# Patient Record
Sex: Female | Born: 1965 | Race: White | Hispanic: No | Marital: Single | Smoking: Never smoker
Health system: Southern US, Community
[De-identification: ages and names within clinical notes are randomized; demographics above are authoritative.]

## PROBLEM LIST (undated history)

## (undated) DIAGNOSIS — M4310 Spondylolisthesis, site unspecified: Secondary | ICD-10-CM

## (undated) DIAGNOSIS — IMO0002 Reserved for concepts with insufficient information to code with codable children: Secondary | ICD-10-CM

## (undated) DIAGNOSIS — M129 Arthropathy, unspecified: Secondary | ICD-10-CM

## (undated) DIAGNOSIS — M4800 Spinal stenosis, site unspecified: Secondary | ICD-10-CM

## (undated) HISTORY — PX: LUMPECTOMY: SHX4764

## (undated) HISTORY — PX: CHOLECYSTECTOMY: SHX55

## (undated) HISTORY — PX: OTHER SURGICAL HISTORY: SHX169

## (undated) HISTORY — PX: BREAST SURGERY: SHX581

## (undated) HISTORY — PX: SPINAL FUSION: SHX223

## (undated) HISTORY — PX: TONSILLECTOMY: SUR1361

---

## 2013-01-15 ENCOUNTER — Emergency Department
Admission: EM | Admit: 2013-01-15 | Discharge: 2013-01-15 | Disposition: A | Discharge status: Home / Self Care | Payer: No Typology Code available for payment source | Attending: Emergency Medicine | Admitting: Emergency Medicine

## 2013-01-15 ENCOUNTER — Emergency Department: Payer: No Typology Code available for payment source

## 2013-01-15 DIAGNOSIS — X58XXXA Exposure to other specified factors, initial encounter: Secondary | ICD-10-CM | POA: Insufficient documentation

## 2013-01-15 DIAGNOSIS — R209 Unspecified disturbances of skin sensation: Secondary | ICD-10-CM | POA: Insufficient documentation

## 2013-01-15 DIAGNOSIS — S139XXA Sprain of joints and ligaments of unspecified parts of neck, initial encounter: Secondary | ICD-10-CM | POA: Insufficient documentation

## 2013-01-15 HISTORY — DX: Spondylolisthesis, site unspecified: M43.10

## 2013-01-15 HISTORY — DX: Reserved for concepts with insufficient information to code with codable children: IMO0002

## 2013-01-15 HISTORY — DX: Arthropathy, unspecified: M12.9

## 2013-01-15 HISTORY — DX: Spinal stenosis, site unspecified: M48.00

## 2013-01-15 MED ORDER — HYDROMORPHONE HCL 2 MG PO TABS
2.00 mg | ORAL_TABLET | Freq: Four times a day (QID) | ORAL | Status: DC | PRN
Start: 2013-01-15 — End: 2013-04-19

## 2013-01-15 MED ORDER — ONDANSETRON 4 MG PO TBDP
4.00 mg | ORAL_TABLET | Freq: Once | ORAL | Status: DC
Start: 2013-01-15 — End: 2013-01-15

## 2013-01-15 MED ORDER — KETOROLAC TROMETHAMINE 30 MG/ML IJ SOLN
60.0000 mg | Freq: Once | INTRAMUSCULAR | Status: AC
Start: 2013-01-15 — End: 2013-01-15
  Administered 2013-01-15: 60 mg via INTRAMUSCULAR
  Filled 2013-01-15: qty 2

## 2013-01-15 MED ORDER — HYDROMORPHONE HCL PF 1 MG/ML IJ SOLN
2.0000 mg | Freq: Once | INTRAMUSCULAR | Status: DC
Start: 2013-01-15 — End: 2013-01-15

## 2013-01-15 NOTE — Discharge Instructions (Signed)
Paresthesias    You have been seen for paresthesias.    Paresthesia is an abnormal sensation (feeling) in any part of the body. The paresthesia itself has no long-term bad effects. People often describe it as tingling, numbness, burning, or pricking of the skin. Many say it feels like "pins and needles," or like the body part is asleep.    Paresthesias can be a symptom of some illnesses. This means there are many things that can cause paresthesias. The paresthesias can be a sign of an underlying medical condition.     Some causes of paresthesias are:   - Skin Problems: Irritation of skin by certain chemicals. Swelling of the skin from an injury. A burn or frostbite can feel like numbness.   - Pressure on a nerve. This can happen when your arm "falls asleep" from lying on it too long. Carpal tunnel syndrome can do the same thing.   Hyperventilation (rapid or deep breathing).   Deficiency in some vitamins and minerals. This includes vitamins B1, B5, and B12.   Electrolyte problems.   Diabetic neuropathy (nerve disorders) from long-standing diabetes.    Problems with circulation.   Strokes.     You may have had some testing to help find out the cause of your paresthesias.    We still do not know the cause of your paresthesias. However, it is safe for you to go home. You may need more tests to figure out the cause.    See your primary care doctor or the referral specialist for more work-up and management of your paresthesias.    YOU SHOULD SEEK MEDICAL ATTENTION IMMEDIATELY, EITHER HERE OR AT THE NEAREST EMERGENCY DEPARTMENT, IF ANY OF THE FOLLOWING OCCUR:   Your arms get weak, numb or paralyzed (can t move), especially on one side.   You have vision loss, trouble speaking or problems thinking.   Your speech is abnormal or one side of your face droops.   You lose consciousness ("pass out") or almost lose consciousness.   You have numbness or tingling after a head, neck or back injury.   You feel  very dizzy or like the room is spinning.   You have other concerns.      Cervical Strain    You have been diagnosed with a neck strain, also called a cervical strain.    The cervical spine is between the base of the skull and the top of the shoulders.    A strain happens when a muscle is stretched, torn or injured. The pain that you feel is caused by inflammation (swelling) or bruising in the muscle. A strain is not the same as a sprain. A sprain is an injury to a ligament that holds bones together.    A cervical strain occurs when the head snaps forward during an accident or a fall. The muscles can easily be strained with this type of movement. It is normal to experience pain over the muscles around the neck but not over the bones of the cervical spine.    The x-rays of your neck showed no evidence of broken bones.    Apply a warm damp washcloth to the neck for 20 minutes at a time, at least 4 times per day. This will reduce your pain. Massaging your neck might also help.    It is normal to feel stiffness and pain in your neck after a strain. This pain may last for the next few days. If your pain stays about   the same or gets better, you probably do not need to see a doctor. However, if your symptoms get worse or you have new symptoms, you should return here or go to the nearest Emergency Department.    Call your physician or go to the nearest Emergency Department if you your pain does not improve within 4 weeks or your pain is bad enough to seriously limit your normal activities.    YOU SHOULD SEEK MEDICAL ATTENTION IMMEDIATELY, EITHER HERE OR AT THE NEAREST EMERGENCY DEPARTMENT, IF ANY OF THE FOLLOWING OCCURS:   Your arms and legs tingle or get numb (lose feeling).   Your arms or legs are weak.   You feel that your neck is unstable.   You lose control of your bladder or bowels. If this were to happen, it may cause you to wet or soil yourself. Some people may actually have problems urinating  instead.   Your pain gets worse.

## 2013-01-15 NOTE — ED Notes (Signed)
Pt states she began having right neck, shoulder and arm pain about a week ago. Pt denies injury. Pt states she is also having "burning" in her buttock area which she states is not new

## 2013-01-15 NOTE — ED Provider Notes (Signed)
Physician/Midlevel provider first contact with patient: 01/15/13 2108         History     Chief Complaint   Patient presents with   . Shoulder Pain     HPI Comments: Patient with history of chronic back pain and disease, started with R upper back pain radiationg down her arm with pins and needles sensations, no weakness, only pain. No injury.     Patient is a 47 y.o. female presenting with shoulder pain. The history is provided by the patient.   Shoulder Pain  This is a new problem. The current episode started in the past 7 days. The problem occurs intermittently. The problem has been unchanged. Associated symptoms include neck pain. Pertinent negatives include no chest pain, chills, coughing, fever or joint swelling. The symptoms are aggravated by bending. She has tried nothing for the symptoms. The treatment provided no relief.       Past Medical History   Diagnosis Date   . Degenerative disc disease    . Spondylolisthesis    . Spinal stenosis    . Arthropathies        Past Surgical History   Procedure Date   . Spinal fusion    . Lumpectomy    . Cholecystectomy    . Cesarean section    . Tonsillectomy    . Adnoidectomy    . Uvulaectomy    . Uterine ablation    . Uterine ablation        No family history on file.    Social  History   Substance Use Topics   . Smoking status: Never Smoker    . Smokeless tobacco: Not on file   . Alcohol Use: Yes      Comment: socially       .     Allergies   Allergen Reactions   . Percocet (Oxycodone-Acetaminophen)        Current/Home Medications    HYDROMORPHONE (DILAUDID) 4 MG TABLET    Take 4 mg by mouth every 4 (four) hours as needed.    IBUPROFEN (ADVIL,MOTRIN) 200 MG TABLET    Take 600 mg by mouth every 6 (six) hours as needed.        Review of Systems   Constitutional: Negative for fever and chills.   HENT: Positive for neck pain. Negative for neck stiffness.    Respiratory: Negative for cough and shortness of breath.    Cardiovascular: Negative for chest pain and leg  swelling.   Musculoskeletal: Positive for back pain. Negative for joint swelling.       Physical Exam    BP 160/68  Pulse 92  Temp 97.1 F (36.2 C)  Resp 16  Ht 1.702 m  Wt 127.007 kg  BMI 43.84 kg/m2  SpO2 98%  LMP 12/25/2012    Physical Exam   Nursing note and vitals reviewed.  Constitutional: She is oriented to person, place, and time. She appears well-developed and well-nourished.   HENT:   Head: Normocephalic and atraumatic.   Eyes: Conjunctivae normal are normal. Right eye exhibits no discharge.   Neck: Normal range of motion. Neck supple.   Cardiovascular: Normal rate and regular rhythm.    Pulmonary/Chest: Effort normal and breath sounds normal.   Abdominal: Soft. Bowel sounds are normal.   Musculoskeletal: Normal range of motion.        Right shoulder: She exhibits normal range of motion, no bony tenderness, no swelling, no effusion, no crepitus, no deformity, no laceration,  normal pulse and normal strength.        Arms:       Good distal sensation and strength, good pulse, mild cervical midline tenderness   Neurological: She is alert and oriented to person, place, and time.   Skin: Skin is warm and dry.   Psychiatric: She has a normal mood and affect. Her behavior is normal.       MDM and ED Course     ED Medication Orders      Start     Status Ordering Provider    01/15/13 2202   ondansetron (ZOFRAN-ODT) disintegrating tablet 4 mg   Once      Route: Oral  Ordered Dose: 4 mg         Ordered Lenor Derrick M    01/15/13 2202   HYDROmorphone (DILAUDID) injection 2 mg   Once      Route: Intramuscular  Ordered Dose: 2 mg         Ordered Kalyna Paolella M                 MDM  Number of Diagnoses or Management Options  Cervical strain, initial encounter:   Paresthesia:   Diagnosis management comments: I, Lenor Derrick PA-C, have been the primary provider for Curt Bears during this Emergency Dept visit.   Oxygen saturation by pulse oximetry is 95%-100%, Normal.  Interventions: None  Needed.  I have reviewed nursing history.     The attending signature signifies review and agreement of the history, physical examination, evaluation, clinical impression and plan except as noted.     DD: cervical spine fracture, strain, radiculopathy    Must f/u with spine.   Patient refused IM shots here and to go medications, prefers prescription, has tolerated dilaudid in past.     CD given to patient.     Procedures  Radiology Results (24 Hour)     Procedure Component Value Units Date/Time    CT Cervical Spine without Contrast [[id number]] Collected:01/15/13 2157    Order Status:Completed  Updated:01/15/13 2207    Narrative:    History: Neck pain.    FINDINGS: Contiguous axial, reformatted coronal and sagittal images  through the cervical spine. There are no comparison studies.        The cervical vertebral body alignment and vertebral body heights are  normally preserved. No acute fractures are seen. At the C3-C4 level  there is a shallow broad posterior disc osteophyte complex which is  eccentric to the right side, mild to moderate spinal canal narrowing,  mild to moderate narrowing of the left neural foramen. At the C5-C6  level of the right foraminal disc osteophyte complex results in moderate  to marked narrowing of the right neural foramen. At the C6-C7 level a  broad posterior disc osteophyte complex results in moderate spinal canal  narrowing, moderate to marked neural foraminal narrowing.      Impression:     Degenerative changes. No CT evidence of an acute osseous  injury to the cervical spine.    Terrilee Croak, MD   01/15/2013 10:03 PM        Clinical Impression & Disposition     Clinical Impression  Final diagnoses:   Cervical strain, initial encounter   Paresthesia        ED Disposition     Discharge Curt Bears discharge to home/self care.    Condition at discharge: Stable  New Prescriptions    HYDROMORPHONE (DILAUDID) 2 MG TABLET    Take 1 tablet (2 mg total) by mouth every 6  (six) hours as needed for Pain (As needed for pain).               Lenor Derrick [place], [place]  01/15/13 2238

## 2013-01-17 NOTE — ED Provider Notes (Signed)
Review of MLP charts:  I, Louis Matte, MD  have reviewed the history, physical exam, evaluation, clinical impression, plan and agree.  I am available to discuss patient's care and see patient if needed    Oliver Barre, MD  01/17/13 2210

## 2013-04-19 ENCOUNTER — Emergency Department: Payer: No Typology Code available for payment source

## 2013-04-19 ENCOUNTER — Emergency Department
Admission: EM | Admit: 2013-04-19 | Discharge: 2013-04-19 | Disposition: A | Discharge status: Home / Self Care | Payer: No Typology Code available for payment source | Attending: Emergency Medicine | Admitting: Emergency Medicine

## 2013-04-19 DIAGNOSIS — M948X9 Other specified disorders of cartilage, unspecified sites: Secondary | ICD-10-CM | POA: Insufficient documentation

## 2013-04-19 MED ORDER — IBUPROFEN 800 MG PO TABS
800.0000 mg | ORAL_TABLET | Freq: Three times a day (TID) | ORAL | Status: DC | PRN
Start: 2013-04-19 — End: 2013-09-14

## 2013-04-19 MED ORDER — IBUPROFEN 400 MG PO TABS
800.0000 mg | ORAL_TABLET | Freq: Once | ORAL | Status: AC
Start: 2013-04-19 — End: 2013-04-19
  Administered 2013-04-19: 800 mg via ORAL
  Filled 2013-04-19: qty 2

## 2013-04-19 MED ORDER — HYDROCODONE-ACETAMINOPHEN 5-325 MG PO TABS
1.0000 | ORAL_TABLET | Freq: Four times a day (QID) | ORAL | Status: DC | PRN
Start: 2013-04-19 — End: 2013-09-11

## 2013-04-19 MED ORDER — HYDROCODONE-ACETAMINOPHEN 5-325 MG PO TABS
1.00 | ORAL_TABLET | Freq: Once | ORAL | Status: AC
Start: 2013-04-19 — End: 2013-04-19
  Administered 2013-04-19: 1 via ORAL
  Filled 2013-04-19: qty 1

## 2013-04-19 NOTE — ED Notes (Signed)
Pt CO left foot pain and plantar swelling x 3 days. Pt states most of the pain is around the great toe. + 2 swelling to plantar surface. Pt denies injury and trauma. Pt unable to walk without a limp and states that limping makes her chronic back pain worse.

## 2013-04-19 NOTE — Discharge Instructions (Signed)
Tendonitis     You have been diagnosed with tendonitis.     Tendons are strong bands of connective tissue that attach the muscles to bone. "Itis" means inflammation. Tendonitis occurs when the tendon and its coverings become irritated and inflamed. The cause is often, but not always, excessive repetitive use of a particular muscle group.     Tendonitis is treated with pain medications and sometimes a splint to reduce the movement of the injured tendon. Additional treatments include Resting, Icing, Compressing, and Elevating the injured area. Remember this as "RICE."  · REST: Limit the use of the injured body part.  · ICE: By applying ice to the affected area, swelling and pain can be reduced. Place some ice cubes in a re-sealable (Ziploc) bag and add some water. Put a thin washcloth between the bag and the skin. Apply the ice bag to the area for at least 20 minutes. Do this at least 4 times per day. Using the ice for longer times and more frequently is OK. NEVER APPLY ICE DIRECTLY TO THE SKIN.  · COMPRESS: Compression means to apply pressure around the injured area such as with a splint, cast or an ace bandage. Compression decreases swelling and improves comfort. Compression should be tight enough to relieve swelling but not so tight as to decrease circulation. Increasing pain, numbness, tingling, or change in skin color, are all signs of decreased circulation.  · ELEVATE: Elevate the injured part. For example, a painful arm can be elevated by placing the arm in a sling while awake and propped up on pillows while lying down.     YOU SHOULD SEEK MEDICAL ATTENTION IMMEDIATELY, EITHER HERE OR AT THE NEAREST EMERGENCY DEPARTMENT, IF ANY OF THE FOLLOWING OCCURS:  · You experience a severe increase in pain or swelling in the affected area.  · You develop new numbness and tingling in or below the affected area.  · You develop a cold, pale foot/hand that appears to have a problem with its blood supply.  · You develop a  fever, chills or increased redness over the affected area.    R.I.C.E.     Some things you can do to help your injury are: Resting, Icing, Compressing and Elevating the injured area. Remember this as "RICE."  · REST: Limit the use of the injured body part.  · ICE: By applying ice to the affected area, swelling and pain can be reduced. Place some ice cubes in a re-sealable (Ziploc) bag and add some water. Put a thin washcloth between the bag and the skin. Apply the ice bag to the area for at least 20 minutes. Do this at least 4 times per day. It is okay to do this more often than directed. You can also do it for longer than directed. NEVER APPLY ICE DIRECTLY TO THE SKIN.  · COMPRESS: Compression means to apply pressure around the injured area such as with a splint, cast or an ace bandage. Compression decreases swelling and improves comfort. Compression should be tight enough to relieve swelling but not so tight as to decrease circulation. Increasing pain, numbness, tingling, or change in skin color, are all signs of decreased circulation.  · ELEVATE: Elevate the injured part. For example, elevate your foot by placing it on a chair while sitting, or propping it up on pillows when lying down.

## 2013-04-19 NOTE — ED Provider Notes (Signed)
Physician/Midlevel provider first contact with patient: 04/19/13 2109         History     Chief Complaint   Patient presents with   . Foot Pain     Patient is a 47 y.o. female presenting with foot injury. The history is provided by the patient.   Foot Injury   Incident onset: 3 days. The incident occurred at home. There was no injury mechanism. Pain location: right foot, 1st metatarsal pain. The quality of the pain is described as aching and throbbing. The pain is at a severity of 7/10. The pain is moderate. The pain has been constant since onset. Pertinent negatives include no numbness, no inability to bear weight, no loss of motion, no muscle weakness, no loss of sensation and no tingling. The symptoms are aggravated by activity, bearing weight and palpation. She has tried NSAIDs for the symptoms. The treatment provided no relief.       Past Medical History   Diagnosis Date   . Degenerative disc disease    . Spondylolisthesis    . Spinal stenosis    . Arthropathies        Past Surgical History   Procedure Date   . Spinal fusion      x 2   . Lumpectomy    . Cholecystectomy    . Cesarean section    . Tonsillectomy    . Adnoidectomy    . Uvulaectomy    . Uterine ablation    . Uterine ablation        No family history on file.    Social  History   Substance Use Topics   . Smoking status: Never Smoker    . Smokeless tobacco: Not on file   . Alcohol Use: Yes      Comment: socially       .     Allergies   Allergen Reactions   . Percocet (Oxycodone-Acetaminophen)        Current/Home Medications    IBUPROFEN (ADVIL,MOTRIN) 200 MG TABLET    Take 600 mg by mouth every 6 (six) hours as needed.        Review of Systems   Constitutional: Negative for fever, chills and unexpected weight change.   HENT: Negative for neck pain and neck stiffness.    Respiratory: Negative for cough, chest tightness and shortness of breath.    Cardiovascular: Negative for chest pain and palpitations.   Gastrointestinal: Negative for nausea,  vomiting and abdominal pain.   Musculoskeletal: Positive for joint swelling and gait problem. Negative for back pain.        Limping due to pain   Skin: Negative for color change, pallor, rash and wound.   Neurological: Negative for dizziness, tingling, weakness, light-headedness, numbness and headaches.   All other systems reviewed and are negative.        Physical Exam    BP 138/90  Pulse 98  Temp 98.7 F (37.1 C)  Resp 20  Ht 1.702 m  Wt 115.667 kg  BMI 39.93 kg/m2  SpO2 100%    Physical Exam   Nursing note and vitals reviewed.  Constitutional: She is oriented to person, place, and time. She appears well-developed and well-nourished.   HENT:   Head: Normocephalic and atraumatic.   Eyes: Conjunctivae normal and EOM are normal. No scleral icterus.   Neck: Normal range of motion. Neck supple.        No meningeal signs   Cardiovascular: Normal rate, regular  rhythm, normal heart sounds and intact distal pulses.    Pulses:       Dorsalis pedis pulses are 2+ on the right side, and 2+ on the left side.   Pulmonary/Chest: Effort normal and breath sounds normal.   Abdominal: Soft. Bowel sounds are normal. She exhibits no distension. There is no tenderness. There is no rebound and no guarding.        No pulsatile mass   Musculoskeletal: Normal range of motion. She exhibits no edema.        Left hip: Normal.        Left knee: Normal.        Thoracic back: Normal. She exhibits normal range of motion, no tenderness and no bony tenderness.        Lumbar back: Normal. She exhibits normal range of motion, no tenderness and no bony tenderness.        Right foot: She exhibits tenderness. She exhibits normal range of motion, no swelling, normal capillary refill and no deformity.        Feet:    Neurological: She is alert and oriented to person, place, and time.   Skin: Skin is warm and dry. No rash noted. No pallor.   Psychiatric: She has a normal mood and affect. Her behavior is normal. Thought content normal.       MDM and  ED Course     ED Medication Orders      Start     Status Ordering Provider    04/19/13 2159   HYDROcodone-acetaminophen (NORCO) 5-325 MG per tablet 1 tablet   Once      Route: Oral  Ordered Dose: 1 tablet         Last MAR action:  Given Nidya Bouyer    04/19/13 2159   ibuprofen (ADVIL,MOTRIN) tablet 800 mg   Once      Route: Oral  Ordered Dose: 800 mg         Last MAR action:  Given Omya Winfield                 MDM  Number of Diagnoses or Management Options  Sesamoiditis:   Diagnosis management comments: Pain to plantar portion of MT joint for 3 days. States she did do the [employer] in [place] 1 month ago, but no pain then. 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 Pain worse with movement and walking    xr shows sesmoid bones and pt has tenderness mainly to the MT plantar region consistent with sesmoiditis. No signs of septic joint or infection. NVI and nml pulses. Plan RICE will f/u with Podiatry.   I, Carolina Sink- Physician Assistant, have been the primary provider for this pt during their ED stay.     The attending signature signifies review and agreement of the history, physical examination, evaluation, clinical impression and plan except as noted.     02 sat  100%ra, which is nml.           Procedures  Radiology Results (24 Hour)     Procedure Component Value Units Date/Time    Foot Left AP Lateral and Oblique [[id number]] Collected:04/19/13 2153    Order Status:Completed  Updated:04/19/13 2158    Narrative:    History: Left foot pain.    FINDINGS: 3 AP, lateral, and oblique views were obtained.    There is no evidence of a fracture. The joint spaces are normal. There  are bipartite  tibial and fibular sesamoids of the first MTP joint. There  is a small inferior calcaneal spur. There is no evidence of soft tissue  gas.      Impression:      No acute changes.    Lanier Ensign, MD   04/19/2013 9:54 PM          Clinical Impression & Disposition     Clinical Impression  Final diagnoses:    Sesamoiditis        ED Disposition     Discharge Curt Bears discharge to home/self care.    Condition at disposition: Stable             New Prescriptions    HYDROCODONE-ACETAMINOPHEN (NORCO) 5-325 MG PER TABLET    Take 1 tablet by mouth every 6 (six) hours as needed for Pain.    IBUPROFEN (ADVIL,MOTRIN) 800 MG TABLET    Take 1 tablet (800 mg total) by mouth every 8 (eight) hours as needed for Pain or Fever.               Mariel Aloe [place], [place]  04/19/13 2236

## 2013-04-20 NOTE — ED Provider Notes (Signed)
ATTENDING : Helayne Seminole, MD.  Review of MLP chart- I have reviewed the history, PE, evaluation, clinical impression, plan and agree.              Helayne Seminole, MD  04/20/13 [phone]

## 2013-04-21 ENCOUNTER — Telehealth (INDEPENDENT_AMBULATORY_CARE_PROVIDER_SITE_OTHER): Payer: Self-pay

## 2013-04-21 NOTE — Telephone Encounter (Signed)
RN Navigator received patient referral following ER visit and placed call to patient to introduce role of Nurse Navigator as well as to assist with an ER follow up appointment.  Spoke with patient who agreed to be sent list of [place] primary care physicians and offered assistance with making ER follow up appointment with a PCP.    Patient followed up with her podiatrist today.

## 2013-08-27 ENCOUNTER — Emergency Department: Payer: No Typology Code available for payment source

## 2013-08-27 ENCOUNTER — Emergency Department
Admission: EM | Admit: 2013-08-27 | Discharge: 2013-08-27 | Disposition: A | Discharge status: Home / Self Care | Payer: No Typology Code available for payment source | Attending: Emergency Medicine | Admitting: Emergency Medicine

## 2013-08-27 DIAGNOSIS — T50905A Adverse effect of unspecified drugs, medicaments and biological substances, initial encounter: Secondary | ICD-10-CM

## 2013-08-27 DIAGNOSIS — M129 Arthropathy, unspecified: Secondary | ICD-10-CM | POA: Insufficient documentation

## 2013-08-27 DIAGNOSIS — M549 Dorsalgia, unspecified: Secondary | ICD-10-CM | POA: Insufficient documentation

## 2013-08-27 DIAGNOSIS — G8929 Other chronic pain: Secondary | ICD-10-CM | POA: Insufficient documentation

## 2013-08-27 DIAGNOSIS — Z981 Arthrodesis status: Secondary | ICD-10-CM | POA: Insufficient documentation

## 2013-08-27 DIAGNOSIS — R1110 Vomiting, unspecified: Secondary | ICD-10-CM

## 2013-08-27 DIAGNOSIS — M4800 Spinal stenosis, site unspecified: Secondary | ICD-10-CM | POA: Insufficient documentation

## 2013-08-27 DIAGNOSIS — T40605A Adverse effect of unspecified narcotics, initial encounter: Secondary | ICD-10-CM | POA: Insufficient documentation

## 2013-08-27 DIAGNOSIS — IMO0002 Reserved for concepts with insufficient information to code with codable children: Secondary | ICD-10-CM | POA: Insufficient documentation

## 2013-08-27 LAB — COMPREHENSIVE METABOLIC PANEL
ALT: 43 U/L (ref 0–55)
AST (SGOT): 36 U/L — ABNORMAL HIGH (ref 5–34)
Albumin/Globulin Ratio: 1.3 (ref 0.9–2.2)
Albumin: 4.4 g/dL (ref 3.5–5.0)
Alkaline Phosphatase: 98 U/L (ref 40–150)
Anion Gap: 14 (ref 5.0–15.0)
BUN: 20.2 mg/dL — ABNORMAL HIGH (ref 7.0–19.0)
Bilirubin, Total: 0.7 mg/dL (ref 0.2–1.2)
CO2: 20 mEq/L — ABNORMAL LOW (ref 22–29)
Calcium: 9.6 mg/dL (ref 8.5–10.5)
Chloride: 104 mEq/L (ref 98–107)
Creatinine: 0.9 mg/dL (ref 0.6–1.0)
Globulin: 3.4 g/dL (ref 2.0–3.6)
Glucose: 172 mg/dL — ABNORMAL HIGH (ref 70–100)
Potassium: 3.9 mEq/L (ref 3.5–5.1)
Protein, Total: 7.8 g/dL (ref 6.0–8.3)
Sodium: 138 mEq/L (ref 136–145)

## 2013-08-27 LAB — CELL MORPHOLOGY
Cell Morphology: NORMAL
Platelet Estimate: NORMAL

## 2013-08-27 LAB — CBC AND DIFFERENTIAL
Basophils Absolute Automated: 0.07 10*3/uL (ref 0.00–0.20)
Basophils Automated: 0 %
Eosinophils Absolute Automated: 0.1 10*3/uL (ref 0.00–0.70)
Eosinophils Automated: 1 %
Hematocrit: 42.5 % (ref 37.0–47.0)
Hgb: 15.1 g/dL (ref 12.0–16.0)
Immature Granulocytes Absolute: 0.06 10*3/uL — ABNORMAL HIGH
Immature Granulocytes: 0 %
Lymphocytes Absolute Automated: 3.57 10*3/uL (ref 0.50–4.40)
Lymphocytes Automated: 21 %
MCH: 32.5 pg — ABNORMAL HIGH (ref 28.0–32.0)
MCHC: 35.5 g/dL (ref 32.0–36.0)
MCV: 91.4 fL (ref 80.0–100.0)
MPV: 9.6 fL (ref 9.4–12.3)
Monocytes Absolute Automated: 1.13 10*3/uL (ref 0.00–1.20)
Monocytes: 7 %
Neutrophils Absolute: 12.35 10*3/uL — ABNORMAL HIGH (ref 1.80–8.10)
Neutrophils: 72 %
Platelets: 342 10*3/uL (ref 140–400)
RBC: 4.65 10*6/uL (ref 4.20–5.40)
RDW: 13 % (ref 12–15)
WBC: 17.22 10*3/uL — ABNORMAL HIGH (ref 3.50–10.80)

## 2013-08-27 LAB — GFR: EGFR: >60

## 2013-08-27 MED ORDER — SODIUM CHLORIDE 0.9 % IV BOLUS
1000.00 mL | Freq: Once | INTRAVENOUS | Status: AC
Start: 2013-08-27 — End: 2013-08-27
  Administered 2013-08-27: 1000 mL via INTRAVENOUS

## 2013-08-27 MED ORDER — PROMETHAZINE HCL 25 MG/ML IJ SOLN
12.5000 mg | Freq: Once | INTRAMUSCULAR | Status: AC
Start: 2013-08-27 — End: 2013-08-27
  Administered 2013-08-27: 12.5 mg via INTRAVENOUS
  Filled 2013-08-27: qty 1

## 2013-08-27 MED ORDER — MORPHINE SULFATE 4 MG/ML IJ/IV SOLN (WRAP)
4.0000 mg | Freq: Once | Status: AC
Start: 2013-08-27 — End: 2013-08-27
  Administered 2013-08-27: 4 mg via INTRAVENOUS
  Filled 2013-08-27: qty 1

## 2013-08-27 MED ORDER — PROMETHAZINE HCL 25 MG RE SUPP
25.0000 mg | Freq: Four times a day (QID) | RECTAL | Status: DC | PRN
Start: 2013-08-27 — End: 2013-09-11

## 2013-08-27 MED ORDER — ONDANSETRON 4 MG PO TBDP
4.00 mg | ORAL_TABLET | Freq: Once | ORAL | Status: AC
Start: 2013-08-27 — End: 2013-08-27
  Administered 2013-08-27: 4 mg via ORAL
  Filled 2013-08-27: qty 1

## 2013-08-27 MED ORDER — KETOROLAC TROMETHAMINE 30 MG/ML IJ SOLN
60.0000 mg | Freq: Once | INTRAMUSCULAR | Status: AC
Start: 2013-08-27 — End: 2013-08-27
  Administered 2013-08-27: 60 mg via INTRAMUSCULAR
  Filled 2013-08-27: qty 2

## 2013-08-27 NOTE — Discharge Instructions (Signed)
Vomiting    You have been seen for vomiting.    Vomiting (throwing-up) can be caused by many different things. Most of the time the cause IS NOT serious. The doctor feels it is safe for you to go home today.    Common causes of vomiting include the following:   Gastroenteritis (stomach flu), usually with diarrhea.   Other illnesses. Sometimes medical conditions like diabetes, heart problems, headaches, or infections can make someone throw up.    Bowel obstructions (blockages) can cause vomiting and make patients unable to have bowel movements (stool) or pass gas.   Vomiting can be a symptom of appendicitis, especially if there is also pain in the right lower abdomen (belly).    Sometimes it is hard to find out what is causing the vomiting. Vomiting can be treated with anti-nausea medicines like Phenergan (Promethazine), Compazine (Prochlorperazine) or Zofran (Ondansetron).    Try to drink liquids to avoid dehydration. Don't drink a lot of fluid all at once. Take small sips throughout the day.    YOU SHOULD SEEK MEDICAL ATTENTION IMMEDIATELY, EITHER HERE OR AT THE NEAREST EMERGENCY DEPARTMENT, IF ANY OF THE FOLLOWING OCCURS:   You can't stop vomiting or your vomiting doesn't get better with medication.   You cannot keep liquids down.   You have severe sudden chest or belly pain after vomiting.   You have abdominal pain.      Chronic Pain Exacerbation    You have been seen for your chronic pain problem.    While the Emergency Department/Urgent Care is available for emergencies related to your painful problem, you need to see your regular doctor for the ongoing care of your pain.    ALL refills for your pain medications MUST be done by your primary care doctor or the pain specialist who takes care of your pain.    The Emergency Department/Urgent Care is not the appropriate place for the ongoing care of chronic pain.    If you have not seen a pain specialist, ask the physician to give you some  information on who to contact and to refer you to one.    YOU SHOULD SEEK MEDICAL ATTENTION IMMEDIATELY, EITHER HERE OR AT THE NEAREST EMERGENCY DEPARTMENT, IF ANY OF THE FOLLOWING OCCURS:   You develop any significant change in your pain pattern.   Your symptoms become worse.   You have any other concerns.    Medication Reaction    You have been seen for an unusual reaction to a medicine.    You are probably having your symptoms because of a reaction to a medicine you used. Because the effects of the medicine are now minor or almost gone, it is safe for you to go home.    Bad or unexpected reactions to drugs happen often. Almost all medicines have POSSIBLE side-effects. Most of these are minor, but sometimes the effects can be more severe. Sometimes, people take many medications at the same time. Some of the medications can react badly with one another.    Medicines are normally safe when you follow the instructions. However, it may be dangerous to take even one pill more than you are supposed to. This is why it is VERY IMPORTANT to follow the instructions on your medicine bottles.    Use the following general instructions:   ALWAYS take your medicines as prescribed.   You may forget to take medicine or miss a pill. If you do, ask your doctor or pharmacist before taking  an extra pill to make up for the one you missed.   NEVER take someone else s medicine that was not prescribed to you.   If you have symptoms that may be caused by medication, tell your doctor before taking any more.    YOU SHOULD SEEK MEDICAL ATTENTION IMMEDIATELY, EITHER HERE OR AT THE NEAREST EMERGENCY DEPARTMENT, IF ANY OF THE FOLLOWING OCCURS:   You have signs of an allergic reaction: You have a rash, shortness of breath, or your lips or tongue become swollen.   You get lightheaded, dizzy or confused, or pass out after taking a medicine.   You think you might harm yourself or others or often think about suicide (killing  yourself).   You are vomiting or have severe headaches.   You have any symptoms you think are severe or possibly dangerous.

## 2013-08-27 NOTE — ED Notes (Addendum)
Pt reports taking oxycodone 10mg  which is prescribed for chronic back pain and reports becoming nauseated 1 hour after vomiting x5 and believes she is having an "allergic reaction" denies hives/redness/swelling throat/difficulty breathing

## 2013-08-27 NOTE — ED Provider Notes (Signed)
Physician/Midlevel provider first contact with patient: 08/27/13 2956         History     Chief Complaint   Patient presents with   . Medication Reaction     HPI Comments: 48 yo F w/ chronic back pain c/o emesis x 4 since 0300 after taking oxycodone 10mg  at 11pm.  Pt reports that she took it with food and 800mg  ibuprofen.  She has had this reaction to this medication in the past.  No abd pain.    [name] presenting with vomiting. The history is provided by the patient.   Emesis   This is a new problem. The current episode started 3 to 5 hours ago. The problem occurs 2 to 4 times per day. The problem has not changed since onset.The emesis has an appearance of stomach contents. There has been no fever. Pertinent negatives include no abdominal pain, no diarrhea and no headaches. Associated symptoms comments: Chronic back pain.       Past Medical History   Diagnosis Date   . Degenerative disc disease    . Spondylolisthesis    . Spinal stenosis    . Arthropathies        Past Surgical History   Procedure Date   . Spinal fusion      x 2   . Lumpectomy    . Cholecystectomy    . Cesarean section    . Tonsillectomy    . Adnoidectomy    . Uvulaectomy    . Uterine ablation    . Uterine ablation        No family history on file.    Social  History   Substance Use Topics   . Smoking status: Never Smoker    . Smokeless tobacco: Not on file   . Alcohol Use: Yes      Comment: socially       .     Allergies   Allergen Reactions   . Percocet (Oxycodone-Acetaminophen)        Current/Home Medications    HYDROCODONE-ACETAMINOPHEN (NORCO) 5-325 MG PER TABLET    Take 1 tablet by mouth every 6 (six) hours as needed for Pain.    IBUPROFEN (ADVIL,MOTRIN) 200 MG TABLET    Take 600 mg by mouth every 6 (six) hours as needed.    IBUPROFEN (ADVIL,MOTRIN) 800 MG TABLET    Take 1 tablet (800 mg total) by mouth every 8 (eight) hours as needed for Pain or Fever.    OXYCODONE HCL PO    Take by mouth.        Review of Systems    Gastrointestinal: Positive for vomiting. Negative for abdominal pain and diarrhea.   Musculoskeletal: Positive for back pain (chronic).   Skin: Negative for rash.   Neurological: Positive for light-headedness. Negative for headaches.   All other systems reviewed and are negative.        Physical Exam    BP: 158/89 mmHg, Heart Rate: 95 , Temp: 97.2 F (36.2 C), Resp Rate: 20 , SpO2: 100 %    Physical Exam   Nursing note and vitals reviewed.  Constitutional: She is oriented to person, place, and time. Vital signs are normal. She appears well-developed and well-nourished. No distress.   HENT:   Head: Normocephalic and atraumatic.   Right Ear: External ear normal.   Left Ear: External ear normal.   Mouth/Throat: Oropharynx is clear and moist.   Eyes: Conjunctivae normal and EOM are  normal. Pupils are equal, round, and reactive to light.   Neck: Normal range of motion. Neck supple. No JVD present.   Cardiovascular: Normal rate, regular rhythm, normal heart sounds and intact distal pulses.    No murmur heard.  Pulmonary/Chest: Effort normal and breath sounds normal. No respiratory distress.   Abdominal: Soft. Bowel sounds are normal. There is no tenderness. There is no rebound and no guarding.   Musculoskeletal: Normal range of motion. She exhibits no edema and no tenderness.   Neurological: She is alert and oriented to person, place, and time. GCS eye subscore is 4. GCS verbal subscore is 5. GCS motor subscore is 6.   Skin: Skin is warm and dry. No pallor.   Psychiatric: She has a normal mood and affect. Her behavior is normal.        Poor eye contact       MDM and ED Course     ED Medication Orders      Start     Status Ordering Provider    08/27/13 0617   ketorolac (TORADOL) injection 60 mg   Once      Route: Intramuscular  Ordered Dose: 60 mg         Ordered Tien Aispuro A    08/27/13 0617   ondansetron (ZOFRAN-ODT) disintegrating tablet 4 mg   Once      Route: Oral  Ordered Dose: 4 mg         Ordered Khyree Carillo  A                 MDM  Number of Diagnoses or Management Options    I, Kalman Drape MD, have been the primary provider for Curt Bears during this Emergency Dept visit.    Oxygen saturation by pulse oximetry is 95%-100% on RA, Normal.  Interventions: Patient Observed.    Likely side effect of medication; will try po Zofran for nausea and IM Toradol for back pain, then po challenge.    Pt vomiting after Zofran po.  IV established for IVF and phenergan.    Pt given morphine for ongoing back pain complaints.    Pt tolerating po prior to discharge.    Procedures    Clinical Impression & Disposition     Clinical Impression  Final diagnoses:   Vomiting   Medication adverse effect, initial encounter   Chronic back pain        ED Disposition     Discharge Curt Bears discharge to home/self care.    Condition at disposition: Stable             New Prescriptions    PROMETHAZINE (PHENERGAN) 25 MG SUPPOSITORY    Place 1 suppository (25 mg total) rectally every 6 (six) hours as needed for Nausea (as needed for severe vomiting).                 Kalman Drape, MD  08/27/13 [phone]

## 2013-09-11 ENCOUNTER — Inpatient Hospital Stay
Admission: EM | Admit: 2013-09-11 | Discharge: 2013-09-15 | DRG: 392 | Disposition: A | Discharge status: Home / Self Care | Payer: No Typology Code available for payment source | Attending: Internal Medicine | Admitting: Internal Medicine

## 2013-09-11 ENCOUNTER — Inpatient Hospital Stay: Payer: No Typology Code available for payment source | Admitting: Internal Medicine

## 2013-09-11 ENCOUNTER — Emergency Department: Payer: No Typology Code available for payment source

## 2013-09-11 DIAGNOSIS — K5732 Diverticulitis of large intestine without perforation or abscess without bleeding: Principal | ICD-10-CM | POA: Diagnosis present

## 2013-09-11 DIAGNOSIS — M549 Dorsalgia, unspecified: Secondary | ICD-10-CM | POA: Diagnosis present

## 2013-09-11 DIAGNOSIS — M479 Spondylosis, unspecified: Secondary | ICD-10-CM | POA: Diagnosis present

## 2013-09-11 DIAGNOSIS — K5792 Diverticulitis of intestine, part unspecified, without perforation or abscess without bleeding: Secondary | ICD-10-CM | POA: Diagnosis present

## 2013-09-11 DIAGNOSIS — R509 Fever, unspecified: Secondary | ICD-10-CM

## 2013-09-11 DIAGNOSIS — Z6841 Body Mass Index (BMI) 40.0 and over, adult: Secondary | ICD-10-CM

## 2013-09-11 DIAGNOSIS — R81 Glycosuria: Secondary | ICD-10-CM

## 2013-09-11 DIAGNOSIS — K921 Melena: Secondary | ICD-10-CM

## 2013-09-11 DIAGNOSIS — R109 Unspecified abdominal pain: Secondary | ICD-10-CM

## 2013-09-11 DIAGNOSIS — R7309 Other abnormal glucose: Secondary | ICD-10-CM

## 2013-09-11 DIAGNOSIS — D72829 Elevated white blood cell count, unspecified: Secondary | ICD-10-CM | POA: Diagnosis present

## 2013-09-11 LAB — URINALYSIS
Bilirubin, UA: NEGATIVE
Glucose, UA: 250 — AB
Ketones UA: NEGATIVE
Leukocyte Esterase, UA: NEGATIVE
Nitrite, UA: NEGATIVE
Protein, UR: NEGATIVE
Specific Gravity UA: 1.026 (ref 1.001–1.035)
Urine pH: 6 (ref 5.0–8.0)
Urobilinogen, UA: 0.2 mg/dL (ref 0.2–2.0)

## 2013-09-11 LAB — MAN DIFF ONLY
Band Neutrophils Absolute: 0.74 10*3/uL (ref 0.00–1.00)
Band Neutrophils: 4 %
Basophils Absolute Manual: 0 10*3/uL (ref 0.00–0.20)
Basophils Manual: 0 %
Eosinophils Absolute Manual: 0.19 10*3/uL (ref 0.00–0.70)
Eosinophils Manual: 1 %
Lymphocytes Absolute Manual: 2.79 10*3/uL (ref 0.50–4.40)
Lymphocytes Manual: 15 %
Monocytes Absolute: 1.49 10*3/uL — ABNORMAL HIGH (ref 0.00–1.20)
Monocytes Manual: 8 %
Neutrophils Absolute Manual: 13.38 10*3/uL — ABNORMAL HIGH (ref 1.80–8.10)
Nucleated RBC: 0 (ref 0–1)
Segmented Neutrophils: 72 %

## 2013-09-11 LAB — COMPREHENSIVE METABOLIC PANEL
ALT: 34 U/L (ref 0–55)
AST (SGOT): 24 U/L (ref 5–34)
Albumin/Globulin Ratio: 1.2 (ref 0.9–2.2)
Albumin: 4.4 g/dL (ref 3.5–5.0)
Alkaline Phosphatase: 105 U/L (ref 40–150)
Anion Gap: 15 (ref 5.0–15.0)
BUN: 9.1 mg/dL (ref 7.0–19.0)
Bilirubin, Total: 1.1 mg/dL (ref 0.2–1.2)
CO2: 20 mEq/L — ABNORMAL LOW (ref 22–29)
Calcium: 9.9 mg/dL (ref 8.5–10.5)
Chloride: 102 mEq/L (ref 98–107)
Creatinine: 0.8 mg/dL (ref 0.6–1.0)
Globulin: 3.6 g/dL (ref 2.0–3.6)
Glucose: 162 mg/dL — ABNORMAL HIGH (ref 70–100)
Potassium: 3.9 mEq/L (ref 3.5–5.1)
Protein, Total: 8 g/dL (ref 6.0–8.3)
Sodium: 137 mEq/L (ref 136–145)

## 2013-09-11 LAB — URINE MICROSCOPIC

## 2013-09-11 LAB — CBC AND DIFFERENTIAL
Hematocrit: 44.4 % (ref 37.0–47.0)
Hgb: 15.8 g/dL (ref 12.0–16.0)
MCH: 33.3 pg — ABNORMAL HIGH (ref 28.0–32.0)
MCHC: 35.6 g/dL (ref 32.0–36.0)
MCV: 93.5 fL (ref 80.0–100.0)
MPV: 10.1 fL (ref 9.4–12.3)
Platelets: 267 10*3/uL (ref 140–400)
RBC: 4.75 10*6/uL (ref 4.20–5.40)
RDW: 14 % (ref 12–15)
WBC: 18.59 10*3/uL — ABNORMAL HIGH (ref 3.50–10.80)

## 2013-09-11 LAB — CELL MORPHOLOGY
Cell Morphology: NORMAL
Platelet Estimate: NORMAL

## 2013-09-11 LAB — GFR: EGFR: >60

## 2013-09-11 LAB — TYPE AND SCREEN
AB Screen Gel: NEGATIVE
ABO Rh: O POS

## 2013-09-11 LAB — LIPASE: Lipase: 41 U/L (ref 8–78)

## 2013-09-11 LAB — HCG, SERUM, QUALITATIVE: Hcg Qualitative: NEGATIVE

## 2013-09-11 LAB — LACTIC ACID, PLASMA: Lactic Acid: 0.9 mmol/L (ref 0.7–2.1)

## 2013-09-11 MED ORDER — SODIUM CHLORIDE 0.9 % IV SOLN
INTRAVENOUS | Status: DC
Start: 2013-09-11 — End: 2013-09-11

## 2013-09-11 MED ORDER — METRONIDAZOLE IN NACL 500 MG/100 ML IV SOLN
500.0000 mg | Freq: Once | INTRAVENOUS | Status: AC
Start: 2013-09-11 — End: 2013-09-11
  Administered 2013-09-11: 500 mg via INTRAVENOUS
  Filled 2013-09-11: qty 100

## 2013-09-11 MED ORDER — HYDROMORPHONE HCL PF 1 MG/ML IJ SOLN
1.0000 mg | Freq: Once | INTRAMUSCULAR | Status: AC
Start: 2013-09-11 — End: 2013-09-11
  Administered 2013-09-11: 1 mg via INTRAVENOUS
  Filled 2013-09-11: qty 1

## 2013-09-11 MED ORDER — ACETAMINOPHEN 500 MG PO TABS
1000.0000 mg | ORAL_TABLET | Freq: Once | ORAL | Status: AC
Start: 2013-09-11 — End: 2013-09-11
  Administered 2013-09-11: 1000 mg via ORAL
  Filled 2013-09-11: qty 2

## 2013-09-11 MED ORDER — SODIUM CHLORIDE 0.9 % IV SOLN
150.0000 mL/h | INTRAVENOUS | Status: DC
Start: 2013-09-11 — End: 2013-09-11
  Administered 2013-09-11: 150 mL/h via INTRAVENOUS

## 2013-09-11 MED ORDER — METRONIDAZOLE IN NACL 500 MG/100 ML IV SOLN
500.0000 mg | Freq: Three times a day (TID) | INTRAVENOUS | Status: DC
Start: 2013-09-12 — End: 2013-09-15
  Administered 2013-09-12 – 2013-09-15 (×9): 500 mg via INTRAVENOUS
  Filled 2013-09-11 (×9): qty 100

## 2013-09-11 MED ORDER — PROMETHAZINE HCL 25 MG/ML IJ SOLN
12.5000 mg | Freq: Four times a day (QID) | INTRAMUSCULAR | Status: DC | PRN
Start: 2013-09-11 — End: 2013-09-15
  Administered 2013-09-12 – 2013-09-14 (×2): 12.5 mg via INTRAVENOUS
  Filled 2013-09-11 (×3): qty 1

## 2013-09-11 MED ORDER — ONDANSETRON HCL 4 MG/2ML IJ SOLN
4.0000 mg | Freq: Once | INTRAMUSCULAR | Status: AC | PRN
Start: 2013-09-11 — End: 2013-09-11
  Administered 2013-09-11: 4 mg via INTRAVENOUS
  Filled 2013-09-11: qty 2

## 2013-09-11 MED ORDER — PANTOPRAZOLE SODIUM 40 MG IV SOLR
40.0000 mg | Freq: Every day | INTRAVENOUS | Status: DC
Start: 2013-09-11 — End: 2013-09-13
  Administered 2013-09-11 – 2013-09-12 (×2): 40 mg via INTRAVENOUS
  Filled 2013-09-11 (×2): qty 40

## 2013-09-11 MED ORDER — PROMETHAZINE HCL 25 MG/ML IJ SOLN
12.5000 mg | Freq: Once | INTRAMUSCULAR | Status: AC
Start: 2013-09-11 — End: 2013-09-11
  Administered 2013-09-11: 12.5 mg via INTRAVENOUS
  Filled 2013-09-11: qty 1

## 2013-09-11 MED ORDER — CIPROFLOXACIN IN D5W 400 MG/200ML IV SOLN
400.0000 mg | Freq: Two times a day (BID) | INTRAVENOUS | Status: DC
Start: 2013-09-12 — End: 2013-09-15
  Administered 2013-09-12 – 2013-09-15 (×6): 400 mg via INTRAVENOUS
  Filled 2013-09-11 (×8): qty 200

## 2013-09-11 MED ORDER — SODIUM CHLORIDE 0.9 % IV BOLUS
1000.0000 mL | Freq: Once | INTRAVENOUS | Status: AC
Start: 2013-09-11 — End: 2013-09-11
  Administered 2013-09-11: 1000 mL via INTRAVENOUS

## 2013-09-11 MED ORDER — CIPROFLOXACIN IN D5W 400 MG/200ML IV SOLN
400.0000 mg | Freq: Once | INTRAVENOUS | Status: AC
Start: 2013-09-11 — End: 2013-09-11
  Administered 2013-09-11: 400 mg via INTRAVENOUS
  Filled 2013-09-11: qty 200

## 2013-09-11 MED ORDER — SODIUM CHLORIDE 0.9 % IV SOLN
INTRAVENOUS | Status: DC
Start: 2013-09-11 — End: 2013-09-15

## 2013-09-11 MED ORDER — ONDANSETRON HCL 4 MG/2ML IJ SOLN
4.0000 mg | Freq: Once | INTRAMUSCULAR | Status: AC
Start: 2013-09-11 — End: 2013-09-11
  Administered 2013-09-11: 4 mg via INTRAVENOUS
  Filled 2013-09-11: qty 2

## 2013-09-11 MED ORDER — IOHEXOL 350 MG/ML IV SOLN
100.0000 mL | Freq: Once | INTRAVENOUS | Status: AC | PRN
Start: 2013-09-11 — End: 2013-09-11
  Administered 2013-09-11: 100 mL via INTRAVENOUS

## 2013-09-11 MED ORDER — HYDROMORPHONE HCL PF 1 MG/ML IJ SOLN
0.5000 mg | INTRAMUSCULAR | Status: DC | PRN
Start: 2013-09-11 — End: 2013-09-15
  Administered 2013-09-12 – 2013-09-15 (×4): 0.5 mg via INTRAVENOUS
  Filled 2013-09-11 (×5): qty 1

## 2013-09-11 NOTE — ED Notes (Signed)
Transport to room 263 A requested

## 2013-09-11 NOTE — H&P (Signed)
ADMISSION HISTORY AND PHYSICAL EXAM    Date Time: 09/11/2013 9:14 PM  Patient Name: Sydney Roach  Attending Physician: Dr Doyne Keel       Chief Complaint:   Abdominal pain    History of Present Illness:   Sydney [facility]ach is a 48 y.o. female who presents to the [facility] with abdominal pain.  Pt reports that she had abdominal pain since night prior to admission that wasn't getting any better.  She describes pain as sharp, stabbing and located mostly in the periumbilical region.  Did have nausea and vomiting.  States she had a bloody mucous stool on day of admission.  Had gone to her PCP who sent her for a CT of her abdomin, but pt's pain got worse, so she came to the ER.  No chest pain or shortness of breath.  Denies fever or chills.    Past Medical History:     Past Medical History   Diagnosis Date   . Degenerative disc disease    . Spondylolisthesis    . Spinal stenosis    . Arthropathies        Past Surgical History:     Past Surgical History   Procedure Date   . Spinal fusion      x 2   . Lumpectomy    . Cholecystectomy    . Cesarean section    . Tonsillectomy    . Adnoidectomy    . Uvulaectomy    . Uterine ablation    . Uterine ablation    . Breast surgery    . Lumpectomy        Family History:     Family History   Problem Relation Age of Onset   . Heart disease Father        Social History:     History     Social History   . Marital Status: Single     Spouse Name: N/A     Number of Children: N/A   . Years of Education: N/A     Social History Main Topics   . Smoking status: Never Smoker    . Smokeless tobacco: Never Used   . Alcohol Use: Yes      Comment: socially - maybe twice a month   . Drug Use: No   . Sexually Active:      Other Topics Concern   . Not on file     Social History Narrative   . No narrative on file       Allergies:     Allergies   Allergen Reactions   . Percocet (Oxycodone-Acetaminophen)        Medications:     Current/Home Medications    IBUPROFEN (ADVIL,MOTRIN) 800 MG TABLET    Take 1  tablet (800 mg total) by mouth every 8 (eight) hours as needed for Pain or Fever.    OXYCODONE HCL PO    Take by mouth.       Review of Systems:   Review of Systems   Constitutional: Positive for fever. Negative for chills, malaise/fatigue and diaphoresis.        Denied fever at home, but low grade here   HENT: Negative for congestion, sore throat and tinnitus.    Eyes: Negative for blurred vision and discharge.   Respiratory: Negative for cough, sputum production, shortness of breath and wheezing.    Cardiovascular: Negative for chest pain, palpitations and leg swelling.   Gastrointestinal: Positive for nausea, vomiting, abdominal pain  and blood in stool. Negative for heartburn, diarrhea and constipation.   Genitourinary: Negative for dysuria, urgency, frequency, hematuria and flank pain.   Musculoskeletal: Negative for back pain, falls, myalgias and neck pain.   Skin: Negative for rash.   Neurological: Negative for dizziness, tingling, sensory change, speech change, seizures, loss of consciousness and headaches.   Endo/Heme/Allergies: Does not bruise/bleed easily.   Psychiatric/Behavioral: Negative for depression, suicidal ideas, memory loss and substance abuse. The patient is not nervous/anxious.          Physical Exam:   Physical Exam   Nursing note and vitals reviewed.  Constitutional: She is oriented to person, place, and time and well-developed, well-nourished, and in no distress.   HENT:   Head: Normocephalic and atraumatic.   Eyes: EOM are normal. Pupils are equal, round, and reactive to light.   Neck: Normal range of motion. Neck supple. No JVD present. No tracheal deviation present. No thyromegaly present.   Cardiovascular: Normal rate, regular rhythm, normal heart sounds and intact distal pulses.  Exam reveals no gallop and no friction rub.    No murmur heard.  Pulmonary/Chest: Effort normal and breath sounds normal. No respiratory distress. She has no wheezes. She exhibits no tenderness.   Abdominal:  Soft. She exhibits no distension, no pulsatile liver, no abdominal bruit and no mass. Bowel sounds are hyperactive. There is tenderness in the periumbilical area. There is no rigidity, no rebound, no guarding and no CVA tenderness.   Musculoskeletal: Normal range of motion. She exhibits no edema.   Neurological: She is alert and oriented to person, place, and time. No cranial nerve deficit. GCS score is 15.   Skin: Skin is warm and dry.   Psychiatric: Mood, memory, affect and judgment normal.         Filed Vitals:    09/11/13 2049   BP: 117/69   Pulse: 101   Temp: 100.3 F (37.9 C)   Resp: 18   SpO2: 95%       Labs:     Results     Procedure Component Value Units Date/Time    Type and Screen [[id number]] Collected:09/11/13 1751    Specimen Information:Blood Updated:09/11/13 1930     ABO Rh O POS      AB Screen Gel NEG     Manual Differential [[id number]]  (Abnormal) Collected:09/11/13 1751     Segmented Neutrophils 72 % Updated:09/11/13 1850     Band Neutrophils 4 %      Lymphocytes Manual 15 %      Monocytes Manual 8 %      Eosinophils Manual 1 %      Basophils Manual 0 %      Nucleated RBC 0      Abs Seg Manual 13.38 (H) x10 3/uL      Bands Absolute 0.74 x10 3/uL      Absolute Lymph Manual 2.79 x10 3/uL      Monocytes Absolute 1.49 (H) x10 3/uL      Absolute Eos Manual 0.19 x10 3/uL      Absolute Baso Manual 0.00 x10 3/uL     Cell MorpHology [[id number]] Collected:09/11/13 1751     Cell Morphology: Normal Updated:09/11/13 1850     Platelet Estimate Normal     CBC with differential [[id number]]  (Abnormal) Collected:09/11/13 1751    Specimen Information:Blood / Blood Updated:09/11/13 1850     WBC 18.59 (H) x10 3/uL      RBC 4.75 x10 6/uL  Hgb 15.8 g/dL      Hematocrit 62.1 %      MCV 93.5 fL      MCH 33.3 (H) pg      MCHC 35.6 g/dL      RDW 14 %      Platelets 267 x10 3/uL      MPV 10.1 fL     Urinalysis [[id number]]  (Abnormal) Collected:09/11/13 1751    Specimen Information:Urine Updated:09/11/13 1839     Urine  Type Clean Catch      Color, UA YELLOW      Clarity, UA CLEAR      Specific Gravity UA 1.026      Urine pH 6.0      Leukocyte Esterase, UA NEGATIVE      Nitrite, UA NEGATIVE      Protein, UR NEGATIVE      Glucose, UA 250 (A)      Ketones UA NEGATIVE      Urobilinogen, UA 0.2 mg/dL      Bilirubin, UA NEGATIVE      Blood, UA MODERATE (A)     Microscopic, Urine [[id number]]  (Abnormal) Collected:09/11/13 1751     RBC, UA 6 - 10 (A) /hpf Updated:09/11/13 1839     WBC, UA 0 - 5 /hpf      Squamous Epithelial Cells, Urine 6 - 10 /hpf      Urine Bacteria Few (A) /hpf     Comprehensive metabolic panel [[id number]]  (Abnormal) Collected:09/11/13 1751    Specimen Information:Blood Updated:09/11/13 1823     Glucose 162 (H) mg/dL      BUN 9.1 mg/dL      Creatinine 0.8 mg/dL      Sodium 253 mEq/L      Potassium 3.9 mEq/L      Chloride 102 mEq/L      CO2 20 (L) mEq/L      CALCIUM 9.9 mg/dL      Protein, Total 8.0 g/dL      Albumin 4.4 g/dL      AST (SGOT) 24 U/L      ALT 34 U/L      Alkaline Phosphatase 105 U/L      Bilirubin, Total 1.1 mg/dL      Globulin 3.6 g/dL      Albumin/Globulin Ratio 1.2      Anion Gap 15.0     Lipase [[id number]] Collected:09/11/13 1751    Specimen Information:Blood Updated:09/11/13 1823     Lipase 41 U/L     GFR [[id number]] Collected:09/11/13 1751     EGFR >60.0 Updated:09/11/13 1823    Beta HCG, Qual, Serum [[id number]] Collected:09/11/13 1751    Specimen Information:Blood Updated:09/11/13 1813     Hcg Qualitative Negative            Rads:   Ct Abd/pelvis With Iv Contrast    09/11/2013  Clinical indication: Periumbilical pain. No prior studies for comparison.  TECHNIQUE: CT of the abdomen and pelvis with IV  contrast. Nonionic contrast was utilized.  FINDINGS: Minimal atelectasis at the lung bases. Small hiatal hernia is present.  The liver demonstrates decreased attenuation, consistent with fatty infiltration. Patient status post cholecystectomy. The spleen, adrenal glands pancreas and kidneys are normal  except for subcentimeter hypodensity in the right kidney which may represent a cyst. Portal vein is patent. There is no biliary dilatation.  There is no bowel obstruction. Moderate stool is present in the colon. Normal appendix is visualized. Colonic diverticulosis, most prominent in the sigmoid colon.  There is mild thickening of the distal sigmoid colon with pericolonic inflammatory changes, consistent with diverticulitis. There is no abscess or evidence of perforation. Trace free pelvic fluid is present. Bladder partially distended. No air in the bladder. Uterus is present. No adnexal masses.  Aorta is normal in caliber. There is no adenopathy. Degenerative changes are present in the lumbar spine. Post surgical changes in the lumbar spine.      09/11/2013   1. Distal sigmoid colon diverticulitis. No abscess or evidence of perforation. 2. Other findings as above.  Jasmine December  D'Heureux, MD  09/11/2013 7:08 PM       Assessment/Plan:      Diverticulitis:  IV antibx (flagyl and cipro).  Hydrate.  Monitor vitals and labs.  NPO.     Abdominal pain:  PRN pain meds and antiemetics     Blood in stool:  Likely from above.  Monitor labs    Leukocytosis:  Likely from above.  IV antibx.  Monitor labs     Back pain:  PRN pain meds.     DVT prophylaxis:  SCDs    Signed by: Remigio Eisenmenger, RN, GNP

## 2013-09-11 NOTE — Progress Notes (Signed)
Pt admitted from the ED.  Pt arrived via stretcher with tech.  No apparent distress.  No c/o pain or sob.  C/o nausea and emesis.  VSS.  Discussed poc with pt and callbell within reach.

## 2013-09-11 NOTE — ED Provider Notes (Signed)
Physician/Midlevel provider first contact with patient: 09/11/13 1648         History     Chief Complaint   Patient presents with   . Rectal Bleeding     HPI Comments: Pt is a 48 yo female with PMH of back pain, takes Advil for pain often, comes to ER for evaluation of bloody BM today. Pt reports she began to vomit 2 wks ago with abdominal pain, pt was given oxycodone for pain for low back pain as pain in abdomen.  Pt was seen in ER for pain evaluation and tx with oxycodone. Pain resolved however the next day pt experienced brown urine after ER visit.  Next day pt saw Dr Jimmye Norman (PCP) had urine culture done and thought that pt possibly had renal stone. Recommended CT scan but pt never had it  Last night pt was getting periumbilical abd pain, this am pain is worse. Saw dr smart today and had CT abd ordered. Pt had bloody small BM prior to ER arrival, no BM since.  Pt had to strain some for BM.   abd pain is 7-10/10 in intensity, constant, movement makes it worse. + decreased appetite.       [name] presenting with hematochezia. The history is provided by the patient.   Rectal Bleeding   Associated symptoms include abdominal pain and vomiting. Pertinent negatives include no fever, no nausea, no rectal pain, no coughing and no rash.       Past Medical History   Diagnosis Date   . Degenerative disc disease    . Spondylolisthesis    . Spinal stenosis    . Arthropathies        Past Surgical History   Procedure Date   . Spinal fusion      x 2   . Lumpectomy    . Cholecystectomy    . Cesarean section    . Tonsillectomy    . Adnoidectomy    . Uvulaectomy    . Uterine ablation    . Uterine ablation    . Breast surgery    . Lumpectomy        Family History   Problem Relation Age of Onset   . Heart disease Father        Social  History   Substance Use Topics   . Smoking status: Never Smoker    . Smokeless tobacco: Never Used   . Alcohol Use: Yes      Comment: socially - maybe twice a month       .      Allergies   Allergen Reactions   . Percocet (Oxycodone-Acetaminophen)        Current/Home Medications    IBUPROFEN (ADVIL,MOTRIN) 800 MG TABLET    Take 1 tablet (800 mg total) by mouth every 8 (eight) hours as needed for Pain or Fever.    OXYCODONE HCL PO    Take by mouth.        Review of Systems   Constitutional: Negative for fever and chills.   HENT: Negative for sore throat.    Respiratory: Negative for cough.    Gastrointestinal: Positive for vomiting, abdominal pain, blood in stool and hematochezia. Negative for nausea and rectal pain.   Musculoskeletal: Negative for back pain.   Skin: Negative for rash.   Neurological: Negative for weakness and numbness.   All other systems reviewed and are negative.        Physical Exam  BP: 192/101 mmHg, Heart Rate: 114 , Temp: 99.4 F (37.4 C), Resp Rate: 22 , SpO2: 97 %, Weight: 122.471 kg    Physical Exam   Nursing note and vitals reviewed.  Constitutional: She is oriented to person, place, and time. She appears well-developed and well-nourished. No distress.   HENT:   Head: Normocephalic and atraumatic.   Right Ear: External ear normal.   Left Ear: External ear normal.   Nose: Nose normal.   Mouth/Throat: Oropharynx is clear and moist.   Eyes: Conjunctivae normal and EOM are normal. Pupils are equal, round, and reactive to light.   Neck: Normal range of motion. Neck supple.   Cardiovascular: Regular rhythm, normal heart sounds and intact distal pulses.         Tachycardia     Pulmonary/Chest: Effort normal and breath sounds normal.   Abdominal: Soft. Bowel sounds are normal. She exhibits distension. There is tenderness. There is no rebound and no guarding.        Periumbilical ttp   Genitourinary: Guaiac negative stool.        No stool in rectal vault, no gross blood   Musculoskeletal: Normal range of motion. She exhibits no edema and no tenderness.   Neurological: She is alert and oriented to person, place, and time. No cranial nerve deficit. Coordination  normal.   Skin: Skin is warm and dry. She is not diaphoretic.   Psychiatric: She has a normal mood and affect. Her behavior is normal. Judgment and thought content normal.       MDM and ED Course     ED Medication Orders      Start     Status Ordering Provider    09/11/13 2212   promethazine (PHENERGAN) injection 12.5 mg   Once      Route: Intravenous  Ordered Dose: 12.5 mg         Last MAR action:  Given CHANDA, [facility]    09/11/13 2100   ciprofloxacin (CIPRO) 400mg  in D5W IVPB (premix)   Once      Route: Intravenous  Ordered Dose: 400 mg         Last MAR action:  [employer], DENNIS J    09/11/13 2036   HYDROmorphone (DILAUDID) injection 1 mg   Once      Route: Intravenous  Ordered Dose: 1 mg         Last MAR action:  Given BERNIER, DENNIS J    09/11/13 2036   acetaminophen (TYLENOL) tablet 1,000 mg   Once      Route: Oral  Ordered Dose: 1,000 mg         Last MAR action:  Given BERNIER, DENNIS J    09/11/13 2035   metroNIDAZOLE (FLAGYL) 500mg  in NS IVPB (premix)   Once      Route: Intravenous  Ordered Dose: 500 mg         Last MAR action:  [employer], DENNIS J    09/11/13 2035      Continuous,   Status:  Discontinued      Route: Intravenous  Ordered Dose: 150 mL/hr         Discontinued BERNIER, DENNIS J    09/11/13 1923   ondansetron (ZOFRAN) injection 4 mg   Once      Route: Intravenous  Ordered Dose: 4 mg         Last MAR action:  Given BERNIER, DENNIS J    09/11/13  1922   HYDROmorphone (DILAUDID) injection 1 mg   Once      Route: Intravenous  Ordered Dose: 1 mg         Last MAR action:  Given BERNIER, DENNIS J    09/11/13 1659   sodium chloride 0.9 % bolus 1,000 mL   Once      Route: Intravenous  Ordered Dose: 1,000 mL         Last MAR action:  Stopped Nyheim Seufert J                 MDM  Number of Diagnoses or Management Options  Abdominal pain:   Diverticulitis:   Elevated random blood glucose level:   Glucose found in urine on examination:   Leukocytosis:   Diagnosis management  comments: The attending signature signifies review and agreement of the history, physical examination, evaluation, clinical impression and plan except as noted.     I, Phyllis Ginger PA-C, have been the primary provider for Sydney Roach during this Emergency Dept visit.    Oxygen saturation by pulse oximetry is 95%-100%, Normal.  Interventions: None Needed.    Ct Abd/pelvis With Iv Contrast    09/11/2013   1. Distal sigmoid colon diverticulitis. No abscess or evidence of perforation. 2. Other findings as above.  Jasmine December  D'Heureux, MD  09/11/2013 7:08 PM     9:02 PM  Pt admitted to hospitalist for further evaluation and treatment.                 Amount and/or Complexity of Data Reviewed  Clinical lab tests: ordered and reviewed  Tests in the radiology section of CPT: ordered and reviewed          Procedures    Clinical Impression & Disposition     Clinical Impression  Final diagnoses:   Leukocytosis   Elevated random blood glucose level   Glucose found in urine on examination   Abdominal pain   Diverticulitis        ED Disposition     Admit Bed Type: General [8]  Admitting Physician: Christel Mormon [[id number]]  Patient Class: Inpatient [101]             New Prescriptions    No medications on file                 Paula Compton, [place]  09/12/13 1925

## 2013-09-11 NOTE — H&P (Signed)
I have directly reviewed the clinical findings, lab, imaging studies and management of this patient in detail. I have interviewed and examined the pt and agree with the documentation,  as recorded by NP.    CC:  Chief Complaint   Patient presents with   . Rectal Bleeding     Abdominal pain is severe, associated with nausea and vomiting, no radiation, no aggravating or relieving factors.    Labs:   Results     Procedure Component Value Units Date/Time    Lactic acid, plasma [[id number]] Collected:09/11/13 2132     Lactic acid 0.9 mmol/L Updated:09/11/13 2153    Type and Screen [[id number]] Collected:09/11/13 1751    Specimen Information:Blood Updated:09/11/13 1930     ABO Rh O POS      AB Screen Gel NEG     Manual Differential [[id number]]  (Abnormal) Collected:09/11/13 1751     Segmented Neutrophils 72 % Updated:09/11/13 1850     Band Neutrophils 4 %      Lymphocytes Manual 15 %      Monocytes Manual 8 %      Eosinophils Manual 1 %      Basophils Manual 0 %      Nucleated RBC 0      Abs Seg Manual 13.38 (H) x10 3/uL      Bands Absolute 0.74 x10 3/uL      Absolute Lymph Manual 2.79 x10 3/uL      Monocytes Absolute 1.49 (H) x10 3/uL      Absolute Eos Manual 0.19 x10 3/uL      Absolute Baso Manual 0.00 x10 3/uL     Cell MorpHology [[id number]] Collected:09/11/13 1751     Cell Morphology: Normal Updated:09/11/13 1850     Platelet Estimate Normal     CBC with differential [[id number]]  (Abnormal) Collected:09/11/13 1751    Specimen Information:Blood / Blood Updated:09/11/13 1850     WBC 18.59 (H) x10 3/uL      RBC 4.75 x10 6/uL      Hgb 15.8 g/dL      Hematocrit 47.4 %      MCV 93.5 fL      MCH 33.3 (H) pg      MCHC 35.6 g/dL      RDW 14 %      Platelets 267 x10 3/uL      MPV 10.1 fL     Urinalysis [[id number]]  (Abnormal) Collected:09/11/13 1751    Specimen Information:Urine Updated:09/11/13 1839     Urine Type Clean Catch      Color, UA YELLOW      Clarity, UA CLEAR      Specific Gravity UA 1.026      Urine pH 6.0       Leukocyte Esterase, UA NEGATIVE      Nitrite, UA NEGATIVE      Protein, UR NEGATIVE      Glucose, UA 250 (A)      Ketones UA NEGATIVE      Urobilinogen, UA 0.2 mg/dL      Bilirubin, UA NEGATIVE      Blood, UA MODERATE (A)     Microscopic, Urine [[id number]]  (Abnormal) Collected:09/11/13 1751     RBC, UA 6 - 10 (A) /hpf Updated:09/11/13 1839     WBC, UA 0 - 5 /hpf      Squamous Epithelial Cells, Urine 6 - 10 /hpf      Urine Bacteria Few (A) /hpf     Comprehensive metabolic panel [[id number]]  (  Abnormal) Collected:09/11/13 1751    Specimen Information:Blood Updated:09/11/13 1823     Glucose 162 (H) mg/dL      BUN 9.1 mg/dL      Creatinine 0.8 mg/dL      Sodium 045 mEq/L      Potassium 3.9 mEq/L      Chloride 102 mEq/L      CO2 20 (L) mEq/L      CALCIUM 9.9 mg/dL      Protein, Total 8.0 g/dL      Albumin 4.4 g/dL      AST (SGOT) 24 U/L      ALT 34 U/L      Alkaline Phosphatase 105 U/L      Bilirubin, Total 1.1 mg/dL      Globulin 3.6 g/dL      Albumin/Globulin Ratio 1.2      Anion Gap 15.0     Lipase [[id number]] Collected:09/11/13 1751    Specimen Information:Blood Updated:09/11/13 1823     Lipase 41 U/L     GFR [[id number]] Collected:09/11/13 1751     EGFR >60.0 Updated:09/11/13 1823    Beta HCG, Qual, Serum [[id number]] Collected:09/11/13 1751    Specimen Information:Blood Updated:09/11/13 1813     Hcg Qualitative Negative             Physical:  Filed Vitals:    09/11/13 1649 09/11/13 1759 09/11/13 1929 09/11/13 2049   BP: 192/101 149/69 146/69 117/69   Pulse: 114 104 109 101   Temp: 99.4 F (37.4 C)  100.4 F (38 C) 100.3 F (37.9 C)   Resp: 22 18 18 18    Height: 1.702 m (5\' 7" )      Weight: 122.471 kg (270 lb)      SpO2: 97% 96% 98% 95%       No intake or output data in the 24 hours ending 09/11/13 2201  Constitutional: She is oriented to person, place, and time and well-developed, well-nourished, and in no distress.   HENT:   Head: Normocephalic and atraumatic.   Eyes: EOM are normal. Pupils are equal, round, and  reactive to light.   Neck: Normal range of motion. Neck supple. No JVD present. No tracheal deviation present. No thyromegaly present.   Cardiovascular: Normal rate, regular rhythm, normal heart sounds and intact distal pulses. Exam reveals no gallop and no friction rub.   No murmur heard.   Pulmonary/Chest: Effort normal and breath sounds normal. No respiratory distress. She has no wheezes. She exhibits no tenderness.   Abdominal: Soft. She exhibits no distension, no pulsatile liver, no abdominal bruit and no mass. Bowel sounds are hyperactive. There is tenderness in the periumbilical area. There is no rigidity, no rebound, no guarding and no CVA tenderness          Assessment and plan:   Acute Diverticulitis with fever and leukocytosis: Admit as inpt because of fever, leukocytosis and abdominal pain from diverticulitis needing IV abx, IV pain meds and IV fluids. IV antibx (flagyl and cipro). Hydrate. Monitor vitals and labs. NPO.   Abdominal pain: PRN pain meds and antiemetics   Blood in stool: Likely from above. Monitor labs. Avoid NSAID's and Lovenox. Use IV protonix.    Time spent 72 min    Christel Mormon, MD

## 2013-09-11 NOTE — ED Notes (Signed)
Pt co lower abd pain started last night. Denies pain to back. Pt noticed hematuria. Pt noticed BRB in stool today. Pt co diarrhea yesterday. Denies rectal pain. Pt sent by PMD for eval.

## 2013-09-12 DIAGNOSIS — K5732 Diverticulitis of large intestine without perforation or abscess without bleeding: Principal | ICD-10-CM

## 2013-09-12 DIAGNOSIS — R509 Fever, unspecified: Secondary | ICD-10-CM

## 2013-09-12 DIAGNOSIS — K921 Melena: Secondary | ICD-10-CM

## 2013-09-12 DIAGNOSIS — R109 Unspecified abdominal pain: Secondary | ICD-10-CM

## 2013-09-12 DIAGNOSIS — D72829 Elevated white blood cell count, unspecified: Secondary | ICD-10-CM

## 2013-09-12 LAB — BASIC METABOLIC PANEL
Anion Gap: 11 (ref 5.0–15.0)
BUN: 8.1 mg/dL (ref 7.0–19.0)
CO2: 20 mEq/L — ABNORMAL LOW (ref 22–29)
Calcium: 8.2 mg/dL — ABNORMAL LOW (ref 8.5–10.5)
Chloride: 108 mEq/L — ABNORMAL HIGH (ref 98–107)
Creatinine: 0.6 mg/dL (ref 0.6–1.0)
Glucose: 122 mg/dL — ABNORMAL HIGH (ref 70–100)
Potassium: 3.9 mEq/L (ref 3.5–5.1)
Sodium: 139 mEq/L (ref 136–145)

## 2013-09-12 LAB — GFR: EGFR: >60

## 2013-09-12 LAB — MAGNESIUM: Magnesium: 1.8 mg/dL (ref 1.6–2.6)

## 2013-09-12 MED ORDER — ACETAMINOPHEN 325 MG PO TABS
650.0000 mg | ORAL_TABLET | Freq: Four times a day (QID) | ORAL | Status: DC | PRN
Start: 2013-09-12 — End: 2013-09-15
  Administered 2013-09-12 – 2013-09-13 (×2): 650 mg via ORAL
  Filled 2013-09-12 (×2): qty 2

## 2013-09-12 MED ORDER — ONDANSETRON HCL 4 MG/2ML IJ SOLN
4.0000 mg | Freq: Four times a day (QID) | INTRAMUSCULAR | Status: DC | PRN
Start: 2013-09-12 — End: 2013-09-15

## 2013-09-12 MED ORDER — IBUPROFEN 400 MG PO TABS
400.0000 mg | ORAL_TABLET | Freq: Once | ORAL | Status: AC
Start: 2013-09-12 — End: 2013-09-12
  Administered 2013-09-12: 400 mg via ORAL
  Filled 2013-09-12: qty 1

## 2013-09-12 MED ORDER — DIAZEPAM 5 MG PO TABS
5.0000 mg | ORAL_TABLET | Freq: Once | ORAL | Status: AC | PRN
Start: 2013-09-12 — End: 2013-09-12
  Administered 2013-09-12: 5 mg via ORAL
  Filled 2013-09-12: qty 1

## 2013-09-12 NOTE — Plan of Care (Signed)
Pt asleep, no complaints at this time, call bell within reach, will continue to monitor, iv fluids infusing.

## 2013-09-12 NOTE — Plan of Care (Signed)
Pt sleeping comfortably with no acute distress.  No c/o pain or sob.  C/o nausea and vomiting.  VSS.  IVF.  NPO.  Discussed poc with pt and callbell within reach.

## 2013-09-12 NOTE — Progress Notes (Signed)
[facility] Hospitalist Daily Progress Note        Date Time: 09/12/2013  1:33 PM  Patient Name:Sydney Roach  ZOX:[id number]  PCP: Jackolyn Confer, MD  Attending Physician:Ramiz Turpin Delane Ginger M.D.      Chief Complaint:      Chief Complaint   Patient presents with   . Rectal Bleeding       Subjective:    lower abdominal pain - improving, nausea no vomiting, no fever    Assessment/Plan     Active Diagnosis: Principal Problem:   *Diverticulitis  Active Problems:   Abdominal pain   Blood in stool   Back pain   Leukocytosis   Acute diverticulitis  1. Acute Diverticulitis: - cont IV abx, IV pain meds and IV fluids. IV antibx (flagyl and cipro).  cont NPO. Trial of clears later today if pain controlled. Consulted GI - DR Sherryll Burger  2. Abdominal pain: due to above prn pain meds and antiemetics   3. Blood in stool: Likely from above. Re am cbc. Avoid NSAID's and Lovenox. Use IV protonix  4. Leucocytosis - due to acute diverticulitis - ivf, abx,   5. DJD, SPinal stenosis  - pain mx    DVT Prohylaxis:scd  Code Status: No Order   Disposition:  Home when stable  Prognosis: guarded  Type of Admission:Inpatient  Estimated Length of Stay (including stay in the ER receiving treatment): >48 hrs  Medical Necessity for stay: acute diverticulitis, req iv abx    Allergies:     Allergies   Allergen Reactions   . Percocet (Oxycodone-Acetaminophen)        Physical Exam:    height is 1.702 m (5\' 7" ) and weight is 122.471 kg (270 lb). Her temperature is 98.6 F (37 C). Her blood pressure is 106/66 and her pulse is 67. Her respiration is 18 and oxygen saturation is 98%.   Body mass index is 42.28 kg/(m^2).  Filed Vitals:    09/11/13 2049 09/11/13 2237 09/12/13 0510 09/12/13 1303   BP: 117/69 125/85 132/88 106/66   Pulse: 101 96 88 67   Temp: 100.3 F (37.9 C) 97.3 F (36.3 C) 97.6 F (36.4 C) 98.6 F (37 C)   Resp: 18 16 18 18    Height:       Weight:       SpO2: 95% 95% 98%      Intake and Output Summary (Last  24 hours) at Date Time  No intake or output data in the 24 hours ending 09/12/13 1333    Constitutional: Patient is oriented to person, place, and time. Patient appears well-developed and well-nourished.   Head: Normocephalic and atraumatic.  Eyes- pupils equal and reactive, extraocular eye movements intact, sclera anicteric  Ears - external ear canals normal, right ear normal, left ear normal  Nose - normal and patent, no erythema,   Mouth - mucous membranes DRY, pharynx normal without lesions  Neck: Normal range of motion. Neck supple. No JVD present.   Cardiovascular: Normal rate, regular rhythm, normal heart sounds and intact distal pulses.  Exam reveals no gallop and no friction rub. No murmur heard.  Pulmonary/Chest: Effort normal and breath sounds normal. No stridor. No respiratory distress. Patient has no wheezes. No rales were present.  Exhibits no tenderness.   Abdominal: Soft. Bowel sounds are normal. Patient exhibits no distension There is LLQ tenderness. There is no rebound and no guarding.   Musculoskeletal: Normal range of motion. Patient exhibits no edema and no tenderness.   Neurological:  Patient is alert and oriented to person, place, and time and has normal reflexes. No cranial nerve deficit.  Normal muscle tone. Coordination normal.   Skin: Skin is warm. No rash noted. Patient is not diaphoretic.   Psychiatric: Has normal mood and affect.  Consult Input/Plan     Plan  None    Review of Systems:   A comprehensive review of systems has no changes since H&P was obtained except as mentioned in the subjective section.    Vitals 24 hrs:   Filed Vitals:    09/11/13 2049 09/11/13 2237 09/12/13 0510 09/12/13 1303   BP: 117/69 125/85 132/88 106/66   Pulse: 101 96 88 67   Temp: 100.3 F (37.9 C) 97.3 F (36.3 C) 97.6 F (36.4 C) 98.6 F (37 C)   Resp: 18 16 18 18    Height:       Weight:       SpO2: 95% 95% 98%         Readmission:   Admission on 08/27/2013, Discharged on 08/27/2013   Component Date  Value Range Status   . WBC 08/27/2013 17.22* 3.50 - 10.80 x10 3/uL Final   . RBC 08/27/2013 4.65  4.20 - 5.40 x10 6/uL Final   . Hgb 08/27/2013 15.1  12.0 - 16.0 g/dL Final   . Hematocrit 30/86/5784 42.5  37.0 - 47.0 % Final   . MCV 08/27/2013 91.4  80.0 - 100.0 fL Final   . MCH 08/27/2013 32.5* 28.0 - 32.0 pg Final   . MCHC 08/27/2013 35.5  32.0 - 36.0 g/dL Final   . RDW 69/62/9528 13  12 - 15 % Final   . Platelets 08/27/2013 342  140 - 400 x10 3/uL Final   . MPV 08/27/2013 9.6  9.4 - 12.3 fL Final   . Neutrophils 08/27/2013 72  None % Final    <6 Bands seen on smear   . Lymphocytes Automated 08/27/2013 21  None % Final   . Monocytes 08/27/2013 7  None % Final   . Eosinophils Automated 08/27/2013 1  None % Final   . Basophils Automated 08/27/2013 0  None % Final   . Immature Granulocyte 08/27/2013 0  None % Final   . Neutrophils Absolute 08/27/2013 12.35* 1.80 - 8.10 x10 3/uL Final   . Abs Lymph Automated 08/27/2013 3.57  0.50 - 4.40 x10 3/uL Final   . Abs Mono Automated 08/27/2013 1.13  0.00 - 1.20 x10 3/uL Final   . Abs Eos Automated 08/27/2013 0.10  0.00 - 0.70 x10 3/uL Final   . Absolute Baso Automated 08/27/2013 0.07  0.00 - 0.20 x10 3/uL Final   . Absolute Immature Granulocyte 08/27/2013 0.06* 0 x10 3/uL Final   . Glucose 08/27/2013 172* 70 - 100 mg/dL Final    Comment: Interpretive Data for Adult Female and Female Population                           Indeterminate Range:  100-125 mg/dL                           Equal to or greater than 126 mg/dL meets the ADA                           guidelines for Diabetes Mellitus diagnosis if symptoms  are present and confirmed by repeat testing.                           Random (Non-Fasting)Interpretive Data (Adults):                           Equal to or greater than 200 mg/dL meets the ADA                           guidelines for Diabetes Mellitus diagnosis if symptoms                           are present and confirmed by Fasting Glucose or  GTT.   . BUN 08/27/2013 20.2* 7.0 - 19.0 mg/dL Final   . Creatinine 16/04/9603 0.9  0.6 - 1.0 mg/dL Final   . Sodium 54/03/8118 138  136 - 145 mEq/L Final   . Potassium 08/27/2013 3.9  3.5 - 5.1 mEq/L Final   . Chloride 08/27/2013 104  98 - 107 mEq/L Final   . CO2 08/27/2013 20* 22 - 29 mEq/L Final   . CALCIUM 08/27/2013 9.6  8.5 - 10.5 mg/dL Final   . Protein, Total 08/27/2013 7.8  6.0 - 8.3 g/dL Final   . Albumin 14/78/2956 4.4  3.5 - 5.0 g/dL Final   . AST (SGOT) 21/30/8657 36* 5 - 34 U/L Final   . ALT 08/27/2013 43  0 - 55 U/L Final   . Alkaline Phosphatase 08/27/2013 98  40 - 150 U/L Final   . Bilirubin, Total 08/27/2013 0.7  0.2 - 1.2 mg/dL Final   . Globulin 84/69/6295 3.4  2.0 - 3.6 g/dL Final   . Albumin/Globulin Ratio 08/27/2013 1.3  0.9 - 2.2 Final   . Anion Gap 08/27/2013 14.0  5.0 - 15.0 Final   . EGFR 08/27/2013 >60.0   Final    Comment: Disease State Reference Ranges:                             Chronic Kidney Disease; < 60 ml/min/1.73 sq.m                             Kidney Failure; < 15 ml/min/1.73 sq.m                             [Calculated using IDMS-Traceable MDRD equation (based on                             gender, age and black vs. non-black race) recommended by                             [employer] Disease Education [employer]. No data                             available for non-white, non-black race.]                           GFR estimates are unreliable in patients with:  Rapidly changing kidney function or recent dialysis,                             extreme age, body size or body composition(obesity,                             severe malnutrition). Abnormal muscle mass (limb                             amputation, muscle wasting). In these patients,                             alternative determinations of GFR should be obtained.   . Cell Morphology: 08/27/2013 Normal   Final   . Platelet Estimate 08/27/2013 Normal   Final        Coagulation Profile: No  results found for this basename: PT,INR,PTT in the last 168 hours       Medications:   Current Facility-Administered Medications   Medication Dose Route Frequency Last Rate Last Dose   . 0.9%  NaCl infusion   Intravenous Continuous 100 mL/hr at 09/12/13 0451     . [COMPLETED] acetaminophen (TYLENOL) tablet 1,000 mg  1,000 mg Oral Once   1,000 mg at 09/11/13 2042   . acetaminophen (TYLENOL) tablet 650 mg  650 mg Oral 4X Daily PRN       . [COMPLETED] ciprofloxacin (CIPRO) 400mg  in D5W IVPB (premix)  400 mg Intravenous Once 200 mL/hr at 09/11/13 2153 400 mg at 09/11/13 2153   . ciprofloxacin (CIPRO) 400mg  in D5W IVPB (premix)  400 mg Intravenous Q12H SCH 200 mL/hr at 09/12/13 1058 400 mg at 09/12/13 1058   . HYDROmorphone (DILAUDID) injection 0.5 mg  0.5 mg Intravenous Q4H PRN   0.5 mg at 09/12/13 1306   . [COMPLETED] HYDROmorphone (DILAUDID) injection 1 mg  1 mg Intravenous Once   1 mg at 09/11/13 1929   . [COMPLETED] HYDROmorphone (DILAUDID) injection 1 mg  1 mg Intravenous Once   1 mg at 09/11/13 2042   . [COMPLETED] ibuprofen (ADVIL,MOTRIN) tablet 400 mg  400 mg Oral Once   400 mg at 09/12/13 0942   . [COMPLETED] iohexol (OMNIPAQUE) 350 MG/ML injection 100 mL  100 mL Intravenous ONCE PRN   100 mL at 09/11/13 1849   . [COMPLETED] metroNIDAZOLE (FLAGYL) 500mg  in NS IVPB (premix)  500 mg Intravenous Once 100 mL/hr at 09/11/13 2042 500 mg at 09/11/13 2042   . metroNIDAZOLE (FLAGYL) 500mg  in NS IVPB (premix)  500 mg Intravenous Q8H SCH 100 mL/hr at 09/12/13 1307 500 mg at 09/12/13 1307   . [COMPLETED] ondansetron (ZOFRAN) injection 4 mg  4 mg Intravenous Once   4 mg at 09/11/13 1927   . [COMPLETED] ondansetron (ZOFRAN) injection 4 mg  4 mg Intravenous Once PRN   4 mg at 09/11/13 2142   . ondansetron (ZOFRAN) injection 4 mg  4 mg Intravenous Q6H PRN       . pantoprazole (PROTONIX) injection 40 mg  40 mg Intravenous Daily   40 mg at 09/11/13 2237   . [COMPLETED] promethazine (PHENERGAN) injection  12.5 mg  12.5 mg Intravenous Once   12.5 mg at 09/11/13 2237   . promethazine (PHENERGAN) injection 12.5 mg  12.5 mg Intravenous Q6H PRN       . [  COMPLETED] sodium chloride 0.9 % bolus 1,000 mL  1,000 mL Intravenous Once   1,000 mL at 09/11/13 1802   . [DISCONTINUED] 0.9%  NaCl infusion  150 mL/hr Intravenous Continuous 150 mL/hr at 09/11/13 2047 150 mL/hr at 09/11/13 2047   . [DISCONTINUED] 0.9%  NaCl infusion   Intravenous Continuous            CBC review:   Lab 09/11/13 1751   WBC 18.59*   HGB 15.8   HCT 44.4   PLT 267   MCV 93.5   RDW 14   NEUTRO 72   LYMPHOCYTESA --   EOSINOPHILSA --   IMMATUREGRAN --   NEUTROABS --   ABSOLUTEIMMA --        Chem Review:  Lab 09/12/13 0646 09/11/13 1751   NA 139 137   K 3.9 3.9   CL 108* 102   CO2 20* 20*   BUN 8.1 9.1   CREAT 0.6 0.8   GLU 122* 162*   CA 8.2* 9.9   MG 1.8 --   PHOS -- --   BILITOTAL -- 1.1   AST -- 24   ALT -- 34   ALKPHOS -- 105        Labs:     Results     Procedure Component Value Units Date/Time    Basic Metabolic Panel [[id number]]  (Abnormal) Collected:09/12/13 0646    Specimen Information:Blood Updated:09/12/13 0728     Glucose 122 (H) mg/dL      BUN 8.1 mg/dL      Creatinine 0.6 mg/dL      CALCIUM 8.2 (L) mg/dL      Sodium 409 mEq/L      Potassium 3.9 mEq/L      Chloride 108 (H) mEq/L      CO2 20 (L) mEq/L      Anion Gap 11.0     GFR [[id number]] Collected:09/12/13 0646     EGFR >60.0 Updated:09/12/13 0728    Magnesium [[id number]] Collected:09/12/13 0646    Specimen Information:Blood Updated:09/12/13 0728     Magnesium 1.8 mg/dL     Lactic acid, plasma [[id number]] Collected:09/11/13 2132     Lactic acid 0.9 mmol/L Updated:09/11/13 2153    Type and Screen [[id number]] Collected:09/11/13 1751    Specimen Information:Blood Updated:09/11/13 1930     ABO Rh O POS      AB Screen Gel NEG     Manual Differential [[id number]]  (Abnormal) Collected:09/11/13 1751     Segmented Neutrophils 72 % Updated:09/11/13 1850     Band Neutrophils 4 %      Lymphocytes Manual 15 %       Monocytes Manual 8 %      Eosinophils Manual 1 %      Basophils Manual 0 %      Nucleated RBC 0      Abs Seg Manual 13.38 (H) x10 3/uL      Bands Absolute 0.74 x10 3/uL      Absolute Lymph Manual 2.79 x10 3/uL      Monocytes Absolute 1.49 (H) x10 3/uL      Absolute Eos Manual 0.19 x10 3/uL      Absolute Baso Manual 0.00 x10 3/uL     Cell MorpHology [[id number]] Collected:09/11/13 1751     Cell Morphology: Normal Updated:09/11/13 1850     Platelet Estimate Normal     CBC with differential [[id number]]  (Abnormal) Collected:09/11/13 1751    Specimen Information:Blood / Blood Updated:09/11/13 1850  WBC 18.59 (H) x10 3/uL      RBC 4.75 x10 6/uL      Hgb 15.8 g/dL      Hematocrit 54.0 %      MCV 93.5 fL      MCH 33.3 (H) pg      MCHC 35.6 g/dL      RDW 14 %      Platelets 267 x10 3/uL      MPV 10.1 fL     Urinalysis [[id number]]  (Abnormal) Collected:09/11/13 1751    Specimen Information:Urine Updated:09/11/13 1839     Urine Type Clean Catch      Color, UA YELLOW      Clarity, UA CLEAR      Specific Gravity UA 1.026      Urine pH 6.0      Leukocyte Esterase, UA NEGATIVE      Nitrite, UA NEGATIVE      Protein, UR NEGATIVE      Glucose, UA 250 (A)      Ketones UA NEGATIVE      Urobilinogen, UA 0.2 mg/dL      Bilirubin, UA NEGATIVE      Blood, UA MODERATE (A)     Microscopic, Urine [[id number]]  (Abnormal) Collected:09/11/13 1751     RBC, UA 6 - 10 (A) /hpf Updated:09/11/13 1839     WBC, UA 0 - 5 /hpf      Squamous Epithelial Cells, Urine 6 - 10 /hpf      Urine Bacteria Few (A) /hpf     Comprehensive metabolic panel [[id number]]  (Abnormal) Collected:09/11/13 1751    Specimen Information:Blood Updated:09/11/13 1823     Glucose 162 (H) mg/dL      BUN 9.1 mg/dL      Creatinine 0.8 mg/dL      Sodium 952 mEq/L      Potassium 3.9 mEq/L      Chloride 102 mEq/L      CO2 20 (L) mEq/L      CALCIUM 9.9 mg/dL      Protein, Total 8.0 g/dL      Albumin 4.4 g/dL      AST (SGOT) 24 U/L      ALT 34 U/L      Alkaline Phosphatase 105 U/L       Bilirubin, Total 1.1 mg/dL      Globulin 3.6 g/dL      Albumin/Globulin Ratio 1.2      Anion Gap 15.0     Lipase [[id number]] Collected:09/11/13 1751    Specimen Information:Blood Updated:09/11/13 1823     Lipase 41 U/L     GFR [[id number]] Collected:09/11/13 1751     EGFR >60.0 Updated:09/11/13 1823    Beta HCG, Qual, Serum [[id number]] Collected:09/11/13 1751    Specimen Information:Blood Updated:09/11/13 1813     Hcg Qualitative Negative         Rads:   Radiological Procedure reviewed.  Radiology Results (24 Hour)     Procedure Component Value Units Date/Time    CT Abd/Pelvis with IV Contrast [[id number]] Collected:09/11/13 1859    Order Status:Completed  Updated:09/11/13 1912    Narrative:    Clinical indication: Periumbilical pain. No prior studies for  comparison.    TECHNIQUE: CT of the abdomen and pelvis with IV  contrast. Nonionic  contrast was utilized.    FINDINGS: Minimal atelectasis at the lung bases. Small hiatal hernia is  present.    The liver demonstrates decreased attenuation, consistent with fatty  infiltration. Patient status  post cholecystectomy. The spleen, adrenal  glands pancreas and kidneys are normal except for subcentimeter  hypodensity in the right kidney which may represent a cyst. Portal vein  is patent. There is no biliary dilatation.    There is no bowel obstruction. Moderate stool is present in the colon.  Normal appendix is visualized. Colonic diverticulosis, most prominent in  the sigmoid colon. There is mild thickening of the distal sigmoid colon  with pericolonic inflammatory changes, consistent with diverticulitis.  There is no abscess or evidence of perforation. Trace free pelvic fluid  is present. Bladder partially distended. No air in the bladder. Uterus  is present. No adnexal masses.    Aorta is normal in caliber. There is no adenopathy. Degenerative changes  are present in the lumbar spine. Post surgical changes in the lumbar  spine.      Impression:      1. Distal sigmoid  colon diverticulitis. No abscess or evidence of  perforation.  2. Other findings as above.    Jasmine December  D'Heureux, MD   09/11/2013 7:08 PM          Time spent for evaluation, management and coordination of care:   38 minutes          Signed by: Lana Fish  09/12/2013 1:33 PM

## 2013-09-13 LAB — CBC AND DIFFERENTIAL
Basophils Absolute Automated: 0.03 10*3/uL (ref 0.00–0.20)
Basophils Automated: 0 %
Eosinophils Absolute Automated: 0.26 10*3/uL (ref 0.00–0.70)
Eosinophils Automated: 3 %
Hematocrit: 38.1 % (ref 37.0–47.0)
Hgb: 13 g/dL (ref 12.0–16.0)
Immature Granulocytes Absolute: 0.02 10*3/uL
Immature Granulocytes: 0 %
Lymphocytes Absolute Automated: 1.38 10*3/uL (ref 0.50–4.40)
Lymphocytes Automated: 15 %
MCH: 32.5 pg — ABNORMAL HIGH (ref 28.0–32.0)
MCHC: 34.1 g/dL (ref 32.0–36.0)
MCV: 95.3 fL (ref 80.0–100.0)
MPV: 9.8 fL (ref 9.4–12.3)
Monocytes Absolute Automated: 0.82 10*3/uL (ref 0.00–1.20)
Monocytes: 9 %
Neutrophils Absolute: 6.59 10*3/uL (ref 1.80–8.10)
Neutrophils: 73 %
Platelets: 208 10*3/uL (ref 140–400)
RBC: 4 10*6/uL — ABNORMAL LOW (ref 4.20–5.40)
RDW: 14 % (ref 12–15)
WBC: 9.08 10*3/uL (ref 3.50–10.80)

## 2013-09-13 LAB — BASIC METABOLIC PANEL
Anion Gap: 11 (ref 5.0–15.0)
BUN: 6.3 mg/dL — ABNORMAL LOW (ref 7.0–19.0)
CO2: 21 mEq/L — ABNORMAL LOW (ref 22–29)
Calcium: 8.4 mg/dL — ABNORMAL LOW (ref 8.5–10.5)
Chloride: 108 mEq/L — ABNORMAL HIGH (ref 98–107)
Creatinine: 0.7 mg/dL (ref 0.6–1.0)
Glucose: 175 mg/dL — ABNORMAL HIGH (ref 70–100)
Potassium: 3.8 mEq/L (ref 3.5–5.1)
Sodium: 140 mEq/L (ref 136–145)

## 2013-09-13 LAB — STOOL FOR WBC: Neutrophils Wright Stain: NONE SEEN

## 2013-09-13 LAB — STOOL OCCULT BLOOD: Stool Occult Blood: NEGATIVE

## 2013-09-13 LAB — MAGNESIUM: Magnesium: 1.8 mg/dL (ref 1.6–2.6)

## 2013-09-13 LAB — GFR: EGFR: >60

## 2013-09-13 MED ORDER — PANTOPRAZOLE SODIUM 40 MG PO TBEC
40.0000 mg | DELAYED_RELEASE_TABLET | Freq: Every day | ORAL | Status: DC
Start: 2013-09-13 — End: 2013-09-15
  Administered 2013-09-13 – 2013-09-15 (×3): 40 mg via ORAL
  Filled 2013-09-13 (×3): qty 1

## 2013-09-13 MED ORDER — DIAZEPAM 2 MG PO TABS
2.0000 mg | ORAL_TABLET | Freq: Three times a day (TID) | ORAL | Status: DC | PRN
Start: 2013-09-13 — End: 2013-09-15
  Administered 2013-09-13 – 2013-09-15 (×5): 2 mg via ORAL
  Filled 2013-09-13 (×5): qty 1

## 2013-09-13 NOTE — Progress Notes (Signed)
Pt IV infiltrated, needs new IV for abx. Pt states that if we cannot get it on the first try that she refuses to allow [place] to attempt a second insertion. RN attempted, pt stated that she does not feel comfortable with the RN doing the IV insertion. Will attempt to find another RN or PCT or anesthesia to insert IV.

## 2013-09-13 NOTE — Plan of Care (Signed)
Problem: Health Promotion  Goal: Knowledge - disease process  Extent of understanding conveyed about a specific disease process.   Outcome: Progressing  Care plan reviewed. Questions encouraged and addressed. IVF infusing. IV abx administered as scheduled. Pt ambulating to BR with no assist. Pt has been anxious about WBC, BP, ABX, morning blood draw, and recent break up; medicated with one time Valium.    Problem: Safety  Goal: Patient will be free from injury during hospitalization  Outcome: Progressing  Rounding Q2h. Call bell within reach. Bed in lowest position.    Problem: Pain  Goal: Patient's pain/discomfort is manageable  Outcome: Progressing  Pain rating scale reviewed with pt. Encouraged to call for PRN pain meds.

## 2013-09-13 NOTE — Progress Notes (Signed)
[facility] Hospitalist Daily Progress Note        Date Time: 09/13/2013  11:28 AM  Patient Name:Sydney Roach  ZOX:[id number]  PCP: Jackolyn Confer, MD  Attending Physician:Bedie Dominey Delane Ginger M.D.      Chief Complaint:      Chief Complaint   Patient presents with   . Rectal Bleeding       Subjective:    lower abdominal pain -slowly improving, nausea no vomiting, no fever, feels very anxious    Assessment/Plan     Active Diagnosis: Principal Problem:   *Diverticulitis  Active Problems:   Abdominal pain   Blood in stool   Back pain   Leukocytosis   Acute diverticulitis  1. Acute Diverticulitis: - improving slowly - will cont IV abx, IV pain meds and IV fluids. Tolerated clears will advance diet today and moniter. Consulted GI - DR Blossers gp - will follow recommendations, likely colonoscopy as o/p in 6-8 wks once inflammation resolves  2. Abdominal pain: due to above prn pain meds and antiemetics   3. Blood in stool: Likely from above.  Hb stable. Avoid NSAID's and Lovenox. Use IV protonix. Follow with GI  4. Leucocytosis - due to acute diverticulitis -improved post ivf, abx,   5. DJD, SPinal stenosis  - pain mx  6. Pt very anxious - prn valium, refuses xanax - says it makes her feel weird  DVT Prohylaxis:scd  Code Status: No Order   Disposition:  Home when stable  Prognosis: guarded  Type of Admission:Inpatient  Estimated Length of Stay (including stay in the ER receiving treatment): >48 hrs  Medical Necessity for stay: acute diverticulitis, req iv abx    Allergies:     Allergies   Allergen Reactions   . Percocet (Oxycodone-Acetaminophen)        Physical Exam:    height is 1.702 m (5\' 7" ) and weight is 122.471 kg (270 lb). Her temporal artery temperature is 97.2 F (36.2 C). Her blood pressure is 140/70 and her pulse is 70. Her respiration is 18 and oxygen saturation is 99%.   Body mass index is 42.28 kg/(m^2).  Filed Vitals:    09/12/13 2113 09/13/13 0152 09/13/13 0539 09/13/13  1102   BP: 158/85 124/67 160/79 140/70   Pulse: 76 81 76 70   Temp: 96.8 F (36 C) 96.6 F (35.9 C) 97.3 F (36.3 C) 97.2 F (36.2 C)   TempSrc: Temporal Artery Temporal Artery Temporal Artery Temporal Artery   Resp: 16 18 20 18    Height:       Weight:       SpO2: 98% 97% 99% 99%     Intake and Output Summary (Last 24 hours) at Date Time    Intake/Output Summary (Last 24 hours) at 09/13/13 1128  Last data filed at 09/13/13 0507   Gross per 24 hour   Intake   2200 ml   Output    500 ml   Net   1700 ml       Constitutional: Patient is oriented to person, place, and time. Patient appears well-developed and well-nourished.   Head: Normocephalic and atraumatic.  Eyes- pupils equal and reactive, extraocular eye movements intact, sclera anicteric  Ears - external ear canals normal, right ear normal, left ear normal  Nose - normal and patent, no erythema,   Mouth - mucous membranes moist, pharynx normal without lesions  Neck: Normal range of motion. Neck supple. No JVD present.   Cardiovascular: Normal rate, regular rhythm, normal heart  sounds and intact distal pulses.  Exam reveals no gallop and no friction rub. No murmur heard.  Pulmonary/Chest: Effort normal and breath sounds normal. No stridor. No respiratory distress. Patient has no wheezes. No rales were present.  Exhibits no tenderness.   Abdominal: Soft. Bowel sounds are normal. Patient exhibits no distension There is LLQ and suprapubic tenderness. There is no rebound and no guarding.   Musculoskeletal: Normal range of motion. Patient exhibits no edema and no tenderness.   Neurological: Patient is alert and oriented to person, place, and time and has normal reflexes. No cranial nerve deficit.  Normal muscle tone. Coordination normal.   Skin: Skin is warm. No rash noted. Patient is not diaphoretic.   Psychiatric: Has normal mood and affect.  Consult Input/Plan     Plan  None    Review of Systems:   A comprehensive review of systems has no changes since H&P was  obtained except as mentioned in the subjective section.    Vitals 24 hrs:   Filed Vitals:    09/12/13 2113 09/13/13 0152 09/13/13 0539 09/13/13 1102   BP: 158/85 124/67 160/79 140/70   Pulse: 76 81 76 70   Temp: 96.8 F (36 C) 96.6 F (35.9 C) 97.3 F (36.3 C) 97.2 F (36.2 C)   TempSrc: Temporal Artery Temporal Artery Temporal Artery Temporal Artery   Resp: 16 18 20 18    Height:       Weight:       SpO2: 98% 97% 99% 99%        Readmission:   Admission on 08/27/2013, Discharged on 08/27/2013   Component Date Value Range Status   . WBC 08/27/2013 17.22* 3.50 - 10.80 x10 3/uL Final   . RBC 08/27/2013 4.65  4.20 - 5.40 x10 6/uL Final   . Hgb 08/27/2013 15.1  12.0 - 16.0 g/dL Final   . Hematocrit 62/37/6283 42.5  37.0 - 47.0 % Final   . MCV 08/27/2013 91.4  80.0 - 100.0 fL Final   . MCH 08/27/2013 32.5* 28.0 - 32.0 pg Final   . MCHC 08/27/2013 35.5  32.0 - 36.0 g/dL Final   . RDW 15/17/6160 13  12 - 15 % Final   . Platelets 08/27/2013 342  140 - 400 x10 3/uL Final   . MPV 08/27/2013 9.6  9.4 - 12.3 fL Final   . Neutrophils 08/27/2013 72  None % Final    <6 Bands seen on smear   . Lymphocytes Automated 08/27/2013 21  None % Final   . Monocytes 08/27/2013 7  None % Final   . Eosinophils Automated 08/27/2013 1  None % Final   . Basophils Automated 08/27/2013 0  None % Final   . Immature Granulocyte 08/27/2013 0  None % Final   . Neutrophils Absolute 08/27/2013 12.35* 1.80 - 8.10 x10 3/uL Final   . Abs Lymph Automated 08/27/2013 3.57  0.50 - 4.40 x10 3/uL Final   . Abs Mono Automated 08/27/2013 1.13  0.00 - 1.20 x10 3/uL Final   . Abs Eos Automated 08/27/2013 0.10  0.00 - 0.70 x10 3/uL Final   . Absolute Baso Automated 08/27/2013 0.07  0.00 - 0.20 x10 3/uL Final   . Absolute Immature Granulocyte 08/27/2013 0.06* 0 x10 3/uL Final   . Glucose 08/27/2013 172* 70 - 100 mg/dL Final    Comment: Interpretive Data for Adult Female and Female Population  Indeterminate Range:  100-125 mg/dL                            Equal to or greater than 126 mg/dL meets the ADA                           guidelines for Diabetes Mellitus diagnosis if symptoms                           are present and confirmed by repeat testing.                           Random (Non-Fasting)Interpretive Data (Adults):                           Equal to or greater than 200 mg/dL meets the ADA                           guidelines for Diabetes Mellitus diagnosis if symptoms                           are present and confirmed by Fasting Glucose or GTT.   . BUN 08/27/2013 20.2* 7.0 - 19.0 mg/dL Final   . Creatinine 16/04/9603 0.9  0.6 - 1.0 mg/dL Final   . Sodium 54/03/8118 138  136 - 145 mEq/L Final   . Potassium 08/27/2013 3.9  3.5 - 5.1 mEq/L Final   . Chloride 08/27/2013 104  98 - 107 mEq/L Final   . CO2 08/27/2013 20* 22 - 29 mEq/L Final   . CALCIUM 08/27/2013 9.6  8.5 - 10.5 mg/dL Final   . Protein, Total 08/27/2013 7.8  6.0 - 8.3 g/dL Final   . Albumin 14/78/2956 4.4  3.5 - 5.0 g/dL Final   . AST (SGOT) 21/30/8657 36* 5 - 34 U/L Final   . ALT 08/27/2013 43  0 - 55 U/L Final   . Alkaline Phosphatase 08/27/2013 98  40 - 150 U/L Final   . Bilirubin, Total 08/27/2013 0.7  0.2 - 1.2 mg/dL Final   . Globulin 84/69/6295 3.4  2.0 - 3.6 g/dL Final   . Albumin/Globulin Ratio 08/27/2013 1.3  0.9 - 2.2 Final   . Anion Gap 08/27/2013 14.0  5.0 - 15.0 Final   . EGFR 08/27/2013 >60.0   Final    Comment: Disease State Reference Ranges:                             Chronic Kidney Disease; < 60 ml/min/1.73 sq.m                             Kidney Failure; < 15 ml/min/1.73 sq.m                             [Calculated using IDMS-Traceable MDRD equation (based on                             gender, age and black vs. non-black race) recommended by  National Kidney Disease Education Program. No data                             available for non-white, non-black race.]                           GFR estimates are unreliable in patients with:                              Rapidly changing kidney function or recent dialysis,                             extreme age, body size or body composition(obesity,                             severe malnutrition). Abnormal muscle mass (limb                             amputation, muscle wasting). In these patients,                             alternative determinations of GFR should be obtained.   . Cell Morphology: 08/27/2013 Normal   Final   . Platelet Estimate 08/27/2013 Normal   Final        Coagulation Profile: No results found for this basename: PT,INR,PTT in the last 168 hours       Medications:   Current Facility-Administered Medications   Medication Dose Route Frequency Last Rate Last Dose   . 0.9%  NaCl infusion   Intravenous Continuous 125 mL/hr at 09/12/13 1539     . acetaminophen (TYLENOL) tablet 650 mg  650 mg Oral 4X Daily PRN   650 mg at 09/12/13 2057   . ciprofloxacin (CIPRO) 400mg  in D5W IVPB (premix)  400 mg Intravenous Q12H SCH 200 mL/hr at 09/13/13 1030 400 mg at 09/13/13 1030   . diazepam (VALIUM) tablet 2 mg  2 mg Oral Q8H PRN       . [COMPLETED] diazepam (VALIUM) tablet 5 mg  5 mg Oral Once PRN   5 mg at 09/12/13 2316   . HYDROmorphone (DILAUDID) injection 0.5 mg  0.5 mg Intravenous Q4H PRN   0.5 mg at 09/12/13 1306   . metroNIDAZOLE (FLAGYL) 500mg  in NS IVPB (premix)  500 mg Intravenous Q8H SCH 100 mL/hr at 09/13/13 0507 500 mg at 09/13/13 0507   . ondansetron (ZOFRAN) injection 4 mg  4 mg Intravenous Q6H PRN       . pantoprazole (PROTONIX) injection 40 mg  40 mg Intravenous Daily   40 mg at 09/12/13 2110   . promethazine (PHENERGAN) injection 12.5 mg  12.5 mg Intravenous Q6H PRN   12.5 mg at 09/12/13 1636        CBC review:     Lab 09/13/13 0731 09/11/13 1751   WBC 9.08 18.59*   HGB 13.0 15.8   HCT 38.1 44.4   PLT 208 267   MCV 95.3 93.5   RDW 14 14   NEUTRO 73 72   LYMPHOCYTESA 15 --   EOSINOPHILSA 3 --   IMMATUREGRAN 0 --   NEUTROABS 6.59 --   ABSOLUTEIMMA  0.02 --        Chem  Review:    Lab 09/13/13 0731 09/12/13 0646 09/11/13 1751   NA 140 139 137   K 3.8 3.9 3.9   CL 108* 108* 102   CO2 21* 20* 20*   BUN 6.3* 8.1 9.1   CREAT 0.7 0.6 0.8   GLU 175* 122* 162*   CA 8.4* 8.2* 9.9   MG 1.8 1.8 --   PHOS -- -- --   BILITOTAL -- -- 1.1   AST -- -- 24   ALT -- -- 34   ALKPHOS -- -- 105        Labs:     Results     Procedure Component Value Units Date/Time    Basic Metabolic Panel [[id number]]  (Abnormal) Collected:09/13/13 0731    Specimen Information:Blood Updated:09/13/13 0806     Glucose 175 (H) mg/dL      BUN 6.3 (L) mg/dL      Creatinine 0.7 mg/dL      CALCIUM 8.4 (L) mg/dL      Sodium 409 mEq/L      Potassium 3.8 mEq/L      Chloride 108 (H) mEq/L      CO2 21 (L) mEq/L      Anion Gap 11.0     GFR [[id number]] Collected:09/13/13 0731     EGFR >60.0 Updated:09/13/13 0806    Magnesium [[id number]] Collected:09/13/13 0731    Specimen Information:Blood Updated:09/13/13 0806     Magnesium 1.8 mg/dL     CBC and differential [[id number]]  (Abnormal) Collected:09/13/13 0731     WBC 9.08 x10 3/uL Updated:09/13/13 0745     RBC 4.00 (L) x10 6/uL      Hgb 13.0 g/dL      Hematocrit 52.8 %      MCV 95.3 fL      MCH 32.5 (H) pg      MCHC 34.1 g/dL      RDW 14 %      Platelets 208 x10 3/uL      MPV 9.8 fL      Neutrophils 73 %      Lymphocytes Automated 15 %      Monocytes 9 %      Eosinophils Automated 3 %      Basophils Automated 0 %      Immature Granulocyte 0 %      Neutrophils Absolute 6.59 x10 3/uL      Abs Lymph Automated 1.38 x10 3/uL      Abs Mono Automated 0.82 x10 3/uL      Abs Eos Automated 0.26 x10 3/uL      Absolute Baso Automated 0.03 x10 3/uL      Absolute Immature Granulocyte 0.02 x10 3/uL     Stool culture [[id number]] Collected:09/13/13 0055    Specimen Information:Stool / Stool Updated:09/13/13 0625    Narrative:    DELAY AFFECTS RESULT    WBC, Stool [[id number]] Collected:09/13/13 0055    Specimen Information:Stool Updated:09/13/13 0129     Segs Wright Stain None Seen     Stool Occult Blood  [[id number]] Collected:09/13/13 0055    Specimen Information:Stool Updated:09/13/13 0111     Stool Occult Blood Negative     Clostridium difficile toxin B PCR [[id number]] Collected:09/13/13 0056    Specimen Information:Stool / Stool Updated:09/13/13 0056    Narrative:    DELAY AFFECTS RESULT        Rads:   Radiological Procedure reviewed.  Radiology Results (24 Hour)     **  No Results found for the last 24 hours. **          Time spent for evaluation, management and coordination of care:   33 minutes          Signed by: Lana Fish  09/13/2013 11:28 AM

## 2013-09-13 NOTE — Plan of Care (Signed)
Problem: Safety  Goal: Patient will be free from injury during hospitalization  Outcome: Progressing  Pt uses call bell within reach, hourly rounding, steady gait, will continue to monitor.    Problem: Pain  Goal: Patient's pain/discomfort is manageable  Outcome: Progressing  Pt has denied pain but does complain of cramping, tolerating clears, will continue to monitor

## 2013-09-13 NOTE — ED Provider Notes (Signed)
Attending, Dr. Roselee Culver. I have personally seen and examined patient. I have fully participated in the care of this patient. I agree with all pertinent and available clinical information, including history, PE, clinical impression, and plan as documented by [facility], except as noted.      Toni Arthurs, DO  09/13/13 Izola Price

## 2013-09-13 NOTE — Progress Notes (Signed)
Gastro Consult dictated:  # [id number]    Imp:  Acute uncomplicated diverticulitis, but w/ rectal bleeding need to consider ischemic colitis - the initial rx remains the same.          Morbid obesity and sleep apnea            Rec:  Continue diet restriction and IV antibx            Monitor rectal bleeding            D/C 1-2 days on oral antibx            Out-pt f/u to schedule interval colonoscopy

## 2013-09-14 LAB — CBC
Hematocrit: 38.4 % (ref 37.0–47.0)
Hgb: 13.4 g/dL (ref 12.0–16.0)
MCH: 32.5 pg — ABNORMAL HIGH (ref 28.0–32.0)
MCHC: 34.9 g/dL (ref 32.0–36.0)
MCV: 93.2 fL (ref 80.0–100.0)
MPV: 9.7 fL (ref 9.4–12.3)
Platelets: 275 10*3/uL (ref 140–400)
RBC: 4.12 10*6/uL — ABNORMAL LOW (ref 4.20–5.40)
RDW: 14 % (ref 12–15)
WBC: 9.06 10*3/uL (ref 3.50–10.80)

## 2013-09-14 LAB — BASIC METABOLIC PANEL
Anion Gap: 11 (ref 5.0–15.0)
BUN: 6.4 mg/dL — ABNORMAL LOW (ref 7.0–19.0)
CO2: 22 mEq/L (ref 22–29)
Calcium: 9.2 mg/dL (ref 8.5–10.5)
Chloride: 105 mEq/L (ref 98–107)
Creatinine: 0.7 mg/dL (ref 0.6–1.0)
Glucose: 142 mg/dL — ABNORMAL HIGH (ref 70–100)
Potassium: 3.6 mEq/L (ref 3.5–5.1)
Sodium: 138 mEq/L (ref 136–145)

## 2013-09-14 LAB — GFR: EGFR: >60

## 2013-09-14 MED ORDER — DIAZEPAM 2 MG PO TABS
2.0000 mg | ORAL_TABLET | Freq: Three times a day (TID) | ORAL | Status: AC | PRN
Start: 2013-09-14 — End: ?

## 2013-09-14 MED ORDER — HYDROCODONE-ACETAMINOPHEN 5-325 MG PO TABS
1.0000 | ORAL_TABLET | Freq: Four times a day (QID) | ORAL | Status: DC | PRN
Start: 2013-09-14 — End: 2017-11-14

## 2013-09-14 MED ORDER — RISAQUAD PO CAPS
1.0000 | ORAL_CAPSULE | Freq: Every day | ORAL | Status: AC
Start: 2013-09-14 — End: 2013-10-05

## 2013-09-14 MED ORDER — LEVOFLOXACIN 500 MG PO TABS
500.0000 mg | ORAL_TABLET | Freq: Every day | ORAL | Status: AC
Start: 2013-09-14 — End: 2013-09-26

## 2013-09-14 MED ORDER — METRONIDAZOLE 500 MG PO TABS
500.0000 mg | ORAL_TABLET | Freq: Three times a day (TID) | ORAL | Status: AC
Start: 2013-09-14 — End: 2013-09-26

## 2013-09-14 NOTE — Discharge Summary (Signed)
[facility] Hospitalist Discharge Note      Date Time: 09/14/2013  12:27 PM  Patient Name:Sydney Roach  NWG:[id number]  PCP: Jackolyn Confer, MD  Attending Physician:Judeth Gilles Delane Ginger M.D.    Hospital Course:   Please see H&P for complete details of HPI and ROS. The patient was admitted to [facility] and has been diagnosed with the following conditions and has been taken care as mentioned below.    Patient Active Problem List    Diagnosis Date Noted   . Diverticulitis 09/11/2013   . Abdominal pain 09/11/2013   . Blood in stool 09/11/2013   . Back pain 09/11/2013   . Leukocytosis 09/11/2013   . Acute diverticulitis 09/11/2013     1. Acute Diverticulitis: -now  improving - was given IV abx - cipro/flagyl, IV pain meds and IV fluids. Tolerated clears and advanced diet. Consulted GI - appreciate eval by DR Jeanett Schlein - recommend, likely colonoscopy as o/p in 6-8 wks , complete abx, o/p follow up  2. Abdominal pain: due to above prn pain meds    3. Blood in stool: Likely from above. Hb stable 13.4. Avoid NSAID's and Lovenox.  Follow with GI as o/p  4. Leucocytosis - due to acute diverticulitis -improved post ivf, complete abx,   5. DJD, SPinal stenosis - pain mx   6. Pt very anxious - prn valium, refuses xanax - says it makes her feel weird    Type of Admission: inpt  Medical Necessity for stay: acute diverticulitis, req iv abx      Date of Admission:   09/11/2013    Date of Discharge:   09/14/2013    Chief Complaint:      Chief Complaint   Patient presents with   . Rectal Bleeding       Discharge Diagnosis:   Hospital Problems:  Principal Problem:   *Diverticulitis  Active Problems:   Abdominal pain   Blood in stool   Back pain   Leukocytosis   Acute diverticulitis      Lists the present on admission hospital problems  Present on Admission:   . Diverticulitis  . Abdominal pain  . Blood in stool  . Back pain  . Leukocytosis  . Acute diverticulitis        Consult Input/Plan     Plan  IP CONSULT TO ANESTHESIOLOGY    Procedures  performed:   none  Physical Exam:    height is 1.702 m (5\' 7" ) and weight is 122.471 kg (270 lb). Her temporal artery temperature is 97.2 F (36.2 C). Her blood pressure is 144/77 and her pulse is 69. Her respiration is 16 and oxygen saturation is 96%.   Body mass index is 42.28 kg/(m^2).  Filed Vitals:    09/13/13 1821 09/13/13 2142 09/14/13 0506 09/14/13 1017   BP: 156/74 159/91 156/81 144/77   Pulse: 89 80 72 69   Temp: 97.7 F (36.5 C) 98.6 F (37 C) 97.9 F (36.6 C) 97.2 F (36.2 C)   TempSrc: Temporal Artery Temporal Artery Temporal Artery Temporal Artery   Resp: 18 18 18 16    Height:       Weight:       SpO2: 99% 96% 95% 96%     Intake and Output Summary (Last 24 hours) at Date Time    Intake/Output Summary (Last 24 hours) at 09/14/13 1227  Last data filed at 09/13/13 1500   Gross per 24 hour   Intake    240 ml  Output      0 ml   Net    240 ml         Labs:     Results     Procedure Component Value Units Date/Time    Basic Metabolic Panel [[id number]]  (Abnormal) Collected:09/14/13 0711    Specimen Information:Blood Updated:09/14/13 0738     Glucose 142 (H) mg/dL      BUN 6.4 (L) mg/dL      Creatinine 0.7 mg/dL      CALCIUM 9.2 mg/dL      Sodium 119 mEq/L      Potassium 3.6 mEq/L      Chloride 105 mEq/L      CO2 22 mEq/L      Anion Gap 11.0     GFR [[id number]] Collected:09/14/13 0711     EGFR >60.0 Updated:09/14/13 0738    CBC without differential [[id number]]  (Abnormal) Collected:09/14/13 0711    Specimen Information:Blood / Blood Updated:09/14/13 0723     WBC 9.06 x10 3/uL      RBC 4.12 (L) x10 6/uL      Hgb 13.4 g/dL      Hematocrit 69.6 %      MCV 93.2 fL      MCH 32.5 (H) pg      MCHC 34.9 g/dL      RDW 14 %      Platelets 275 x10 3/uL      MPV 9.7 fL     Stool culture [[id number]] Collected:09/13/13 0055    Specimen Information:Stool / Stool Updated:09/14/13 0216    Narrative:    ORDER#: [id number]                                    ORDERED BY: NEWCOMER, CHRIS  SOURCE: Stool                                         COLLECTED:  09/13/13 00:55  ANTIBIOTICS AT COLL.:                                RECEIVED :  09/13/13 06:25  Culture Stool                              FINAL       09/14/13 02:16  09/14/13   Absence of normal enteric flora             No Salmonella or Shigella isolated  Shiga Toxin Assay                          PENDING  Campylobacter Antigen Assay                FINAL       09/13/13 12:11  09/13/13   Negative for Campylobacter Antigen      Clostridium difficile toxin B PCR [[id number]] Collected:09/13/13 0056    Specimen Information:Stool / Stool Updated:09/13/13 1603    Narrative:    ORDER#: [id number]  ORDERED BY: Kaylynn Chamblin  SOURCE: Stool                                        COLLECTED:  09/13/13 00:56  ANTIBIOTICS AT COLL.:                                RECEIVED :  09/13/13 13:46  Clostridium difficile toxin B PCR          FINAL       09/13/13 16:03  09/13/13   Negative for Clostridium difficile Toxin B gene             Test performed using a BDMax PCR assay.             Due to the high sensitivity of the assay, duplicate testing             is not recommended. Testing will be restricted to one             specimen per 7 days (contact the laboratory to request             additional testing on a case by case basis).             Molecular detection of the C. difficile toxin B gene is not             recommended as a test of cure or for surveillance purposes.             Testing will not be performed on formed stool.              Rads:   Radiological Procedure reviewed.  Ct Abd/pelvis With Iv Contrast    09/11/2013  Clinical indication: Periumbilical pain. No prior studies for comparison.  TECHNIQUE: CT of the abdomen and pelvis with IV  contrast. Nonionic contrast was utilized.  FINDINGS: Minimal atelectasis at the lung bases. Small hiatal hernia is present.  The liver demonstrates decreased attenuation, consistent with fatty infiltration. Patient status post  cholecystectomy. The spleen, adrenal glands pancreas and kidneys are normal except for subcentimeter hypodensity in the right kidney which may represent a cyst. Portal vein is patent. There is no biliary dilatation.  There is no bowel obstruction. Moderate stool is present in the colon. Normal appendix is visualized. Colonic diverticulosis, most prominent in the sigmoid colon. There is mild thickening of the distal sigmoid colon with pericolonic inflammatory changes, consistent with diverticulitis. There is no abscess or evidence of perforation. Trace free pelvic fluid is present. Bladder partially distended. No air in the bladder. Uterus is present. No adnexal masses.  Aorta is normal in caliber. There is no adenopathy. Degenerative changes are present in the lumbar spine. Post surgical changes in the lumbar spine.      09/11/2013   1. Distal sigmoid colon diverticulitis. No abscess or evidence of perforation. 2. Other findings as above.  Jasmine December  D'Heureux, MD  09/11/2013 7:08 PM       Discharge Medications:     Please See Discharge Medication reconciliation for the final list of medications.    Pending Labs:   none  Discharge Destination:    home  Condition at Discharge :   Improved    Time spent for Discharge Care:    37 minutes    Follow-up:   Dr  Shoham GI in 1 week  Recommended Follow up with PCP in one week.    Signed by: Lana Fish, MD

## 2013-09-14 NOTE — Consults (Signed)
Service Date: 09/13/2013     Patient Type: I     CONSULTING PHYSICIAN: Macon Large MD     REFERRING PHYSICIAN:      CONSULTING SERVICE:    Gastroenterology.       REFERRING PHYSICIAN:   Hospitalist.     REASON FOR ADMISSION:     Sydney Roach is a 48 year old female admitted to [facility]  for suspected diverticulitis.     HISTORY OF PRESENT ILLNESS:     The patient was in her usual state of health until the day prior to  admission when she noted the onset of some abdominal pain and watery  diarrhea.  The diarrhea then became bloody, and the patient was admitted.   At no time during this illness did she report any nausea, vomiting, fever,  or shaking chills; and since admission, the patient has had an improved  abdominal pain syndrome, but continues to pass some bloody stool.  The CT  scan demonstrated suspected diverticulitis for which she is being treated  appropriately with antibiotics.  When questioned carefully, the patient  states that she had a colonoscopy 6 years ago, but cannot recall whether  they noted diverticular disease or not.  The patient had at least 2 prior  intestinal infections over the years, but she cannot document whether these  were truly diverticulitis or other infections.     PAST SURGICAL HISTORY:     The patient has had a C-section, a cholecystectomy, an uvulectomy for sleep  apnea and 2 lumbar laminectomies.     PAST MEDICAL HISTORY:  The patient has sleep apnea.  The patient denies any other medical disease  such as hypertension, diabetes, hyperlipidemia, and hyperlipidemia,  coronary artery disease, etc.     MEDICATIONS:  She takes no chronic prescription medication.     SOCIAL HISTORY:  The patient does not smoke, but does drink alcoholic beverages on weekends.     FAMILY HISTORY:  Noncontributory.     PHYSICAL EXAMINATION:  VITAL SIGNS:  Stable and the patient was afebrile.    HEENT:   She had anicteric sclera, pink conjunctivae.  No peripheral  lymphadenopathy.  No  peripheral liver stigmata.  EXTREMITIES:  No cyanosis, clubbing, or edema.  CHEST:  Clear.  ABDOMEN:  Largely negative being obese, nondistended, and soft.  She had  mild left lower quadrant and suprapubic tenderness only to deep palpation,  but there was no guarding, rebound, or percussion tenderness; and no masses  or hepatosplenomegaly were present.     LABORATORY AND DIAGNOSTIC DATA:     The patient's admission white count was 18.6 falling to 9000 and her  hematocrit/hemoglobin of 44/15.8 have fallen to 38 and 13.0, platelet count  was normal.  Chem-8 was normal.  Liver enzymes and lipase are normal.     CT scan of the abdomen demonstrated hepatic steatosis and sigmoid  diverticulosis with probable associated diverticulitis.     DIAGNOSTIC IMPRESSION:  1.  The patient most likely has acute uncomplicated diverticulitis.   Because of the fact that she has seen some rectal bleeding associated with  defecation, I would raise the question about whether this might not be an  episode of ischemic colitis.  In either event, the treatment is the same at  the present time.  2.  Morbid obesity and sleep apnea.     RECOMMENDATIONS:  1.  The patient is being treated appropriately with dietary restriction and  IV antibiotics.  2.  The patient should be ready for discharge in 1 to 2 days with a 10-day  course of oral antibiotics.  3.  The patient should be followed in my office in one month to assure  complete response and to schedule an interval colonoscopy in 2 months.           D:  09/13/2013 14:16 PM by Dr. Joette Catching A. Jeanett Schlein, MD [phone])  T:  09/14/2013 07:11 AM by [id number]      Everlean Cherry: [id number]) (Doc ID: [id number])

## 2013-09-14 NOTE — Progress Notes (Signed)
Anesthesia consult received from Dr. Ricka Burdock for IV insertion. Spoke with anesthesia; currently in the OR. Will be up to do IV when possible. Informed patient. Will continue to monitor.

## 2013-09-14 NOTE — Progress Notes (Signed)
Pt does not want to be discharge, Sated BP is high (168/86). Dr Minerva Fester made aware, Order received to cancel discharge order, Currently oob to chair, No distress noted, Report given to nightshift RN.

## 2013-09-14 NOTE — Progress Notes (Signed)
Anesthesia called; Informed RN he will be up soon. Pt informed, table set up. Pt anxious; will medicate with valium.

## 2013-09-14 NOTE — Discharge Instructions (Signed)
Diverticulitis    Some people develop pouches along the wall of the colon as they get older. The pouches,called diverticuli, usually cause no symptoms. If the pouches become blocked, an infection may occur known as diverticulitis. This causes lower abdominal pain and fever. If not treated, it can become a serious condition, causing an abscess to form inside the pouch. The abscess may block the instestinal tract even or rupture, spreading infection throughout the abdomen.  When treatment is started early, oral antibiotics alone may be enough to cure diverticulitis. This method is tried first. However, if you do not improve or if your condition worsens while you are trying oral antibiotics, it will be necessary to admit you to the hospital for IV antibiotics. Severe cases may require surgery.  Home care  The following guidelines will help you care for your diverticulitis at home:   During the acute illness, rest and follow a low-fiber diet:   Foods to Include: flake cereal, mashed potatoes, pancakes, waffles, pasta, white bread, rice, applesauce, bananas, eggs, meat, fish, poultry, tofu, cooked vegetables without seeds.   Foods to Avoid: bran, wheat germ, bread or cereal with nuts, wheatgerm flour, brown or wild rice, corn, corn meal, corn bread, fruits with skins; raw vegetables.   Take antibiotics exactly as directed. Do not miss any doses or stop taking the medication, even if you feel better.   Monitor your temperature and report any rising temperature to your doctor.  Preventing future attacks  Once you have had an episode of diverticulitis, you are at risk of having a recurrence. After you have recovered from this episode, you may be able to reduce your risk by eating a high-fiber diet (20-35 gm/day of fiber). This cleans out the colon pouches that already exist and prevent new ones from forming. Foods high in fiber includes fresh fruits and edible peelings, raw or lightly cooked vegetables, whole grain  cereals, breads with nuts or seeds, dried beans and peas, bran.  Follow-up care  Follow up with your doctor as advised or sooner if you are not improving in the nexttwo days.  When to seek medical care  Get prompt medical attention if any of the following occur:   Fever of 100.37F (38C) or higher, or as directed by your health care provider   Repeated vomiting or swelling of the abdomen   Weakness, dizziness, light-headedness   Increasing abdominal pain that becomes severe or spreads to your back   Pain that moves to the right lower abdomen   Rectal bleeding (red, black or maroon color of the stools)   Unexpected vaginal bleeding   [address], [place], [place] [address]. All rights reserved. This information is not intended as a substitute for professional medical care. Always follow your healthcare professional's instructions.    Ask3Teach3 Program    Education about New Medications and their Side effects    Dear Sydney Roach,    Its been a pleasure taking care of you during your hospitalization here at [facility]. We have initiated a new program to educate our patients and/or their family members or designated personnel about the new medications started by your physicians and their indications along with the possible side effects. Multiple studies have shown that patients started on new medications are often unaware of the names of the medication along with the indications and their side effects which leads to decreased compliance with the medications.    During our conversation today on 09/14/2013  12:26 PM I have explained to you the name of the new medication and the indication along with some possible common side effects. Listed below are some of the new medications started during this hospitalization.     Please call the Nurse if you have any side effects while in hospital.     Please call 911 if you have any life threatening symptoms after you are  discharged from the hospital.    Please inform your Primary care physician for common side effects which are not life threatening after discharge.    Medication: Metronidazole(Flagyl)   This Medication is used for:   Bacterial Infections    Common Side Effects are:   Metallic taste   Abdominal pain   Yeast infection    A note from your nurse:  Call your nurse immediately if you notice itching, hives, swelling or trouble breathing       Medication: Levofloxacin(Levaquin)   This Medication is used for:   Bacterial Infections    Common Side Effects are:   Nausea   Diarrhea   Upset Stomach    A note from your nurse:  Call your nurse immediately if you notice itching, hives, swelling or trouble breathing       Medication Name: Diazepam(Valium)   This Medication is used for:   Anxiety   Panic Attack    Common Side Effects are:   Dizziness   Confusion   Drowsiness    A note from your nurse:  Call your nurse immediately if you notice itching, hives, swelling or trouble breathing       Medication: Hydrocodone/APAP(Norco)   This Medication is used for:   Moderate to Severe pain    Common Side Effects are:   Drowsiness   Dizziness   Constipation   Itching    A note from your nurse:  Call your nurse immediately if you notice itching, hives, swelling or trouble breathing         Thank you for your time.    Lana Fish, MD  09/14/2013  12:26 PM  [facility]  [address]  [place], [place]  [address]  Discharge Instructions        Follow with Primary MD Jackolyn Confer, MD in 7 days     Get CBC, CMP, checked in 7 days by Primary MD and again as instructed by your Primary MD. Get Medicines reviewed and adjusted.    Please request your Prim.MD to go over all Hospital Tests and Procedure/Radiological results at the follow up, please get all Hospital records sent to your Prim MD by signing hospital release before you go home.    Follow with Dr Jeanett Schlein in [place]    Activity: as tolerated Fall precautions use  walker/cane & assistance as needed      Do not drive when taking Pain medications.     Wear Seat belts while driving.    Diet: low residue    Disposition home    If you experience worsening of your admission symptoms, develop shortness of breath, life threatening emergency, suicidal or homicidal thoughts you must seek medical attention immediately by calling 911 or calling your MD immediately  if symptoms less severe.    Do not take more than prescribed Pain, Sleep and Anxiety Medications    Special Instructions: If you have smoked or chewed Tobacco  in the last 2 yrs please stop smoking, stop any regular Alcohol  and or any Recreational drug use.  You Must read complete instructions/literature along with all the possible adverse reactions/side effects for all the Medicines you take and that have been prescribed to you. Take any new Medicines after you have completely understood and accepted all the possible adverse reactions/side effects.

## 2013-09-14 NOTE — Progress Notes (Signed)
Assessment done earlier, Pt denies abd pain, Medicated for nausea,  IV running as ordered, Pt resting in bed, call bell within reach. Will continue to monitor.

## 2013-09-14 NOTE — Progress Notes (Addendum)
Anesthesia called with no answer. Pt allowed one IV stick which was unsuccessful. Will call anesthesia again.

## 2013-09-14 NOTE — Progress Notes (Signed)
[place]; Attempted to reach patient via room phone, no response.

## 2013-09-15 MED ORDER — METOPROLOL TARTRATE 25 MG PO TABS
25.0000 mg | ORAL_TABLET | Freq: Two times a day (BID) | ORAL | Status: AC
Start: 2013-09-15 — End: 2013-10-15

## 2013-09-15 MED ORDER — METOPROLOL TARTRATE 25 MG PO TABS
25.0000 mg | ORAL_TABLET | Freq: Two times a day (BID) | ORAL | Status: DC
Start: 2013-09-15 — End: 2013-09-15
  Administered 2013-09-15: 25 mg via ORAL
  Filled 2013-09-15: qty 1

## 2013-09-15 NOTE — Progress Notes (Signed)
VSS. Patient has slept well through the night. Voiced no needs. Pain managed with dilaudid 0.5 mg x1.

## 2013-09-15 NOTE — Plan of Care (Signed)
Problem: Safety  Goal: Patient will be free from injury during hospitalization  Outcome: Progressing  Intervention: Assess patient's risk for falls and implement fall prevention plan of care per policy  Reviewed fall safety plan. Call bell within reach and bed alarm on. Performing hourly rounding.      Problem: Pain  Goal: Patient's pain/discomfort is manageable  Outcome: Progressing  Intervention: Include patient/family/caregiver in decisions related to pain management  Managing pain with dilaudid 0.5 mg IV Q4 PRN

## 2013-09-15 NOTE — Discharge Summary (Signed)
[facility] Hospitalist Discharge Note      Date Time: 09/15/2013  12:37 PM  Patient Name:Sydney Roach  ZOX:[id number]  PCP: Jackolyn Confer, MD  Attending Physician:Temesha Queener Delane Ginger M.D.    Hospital Course:   Please see H&P for complete details of HPI and ROS. The patient was admitted to [facility] and has been diagnosed with the following conditions and has been taken care as mentioned below.    Patient Active Problem List    Diagnosis Date Noted   . Diverticulitis 09/11/2013   . Abdominal pain 09/11/2013   . Blood in stool 09/11/2013   . Back pain 09/11/2013   . Leukocytosis 09/11/2013   . Acute diverticulitis 09/11/2013     1. Acute Diverticulitis: -now  improving - was given IV abx - cipro/flagyl, IV pain meds and IV fluids. Tolerated clears and advanced diet. Consulted GI - appreciate eval by DR Jeanett Schlein - recommend, likely colonoscopy as o/p in 6-8 wks , complete abx, o/p follow up  2. Abdominal pain: due to above prn pain meds    3. Blood in stool: Likely from above. Hb stable 13.4. Avoid NSAID's and Lovenox.  Follow with GI as o/p  4. Leucocytosis - due to acute diverticulitis -improved post ivf, complete abx,   5. DJD, SPinal stenosis - pain mx   6. Pt very anxious - prn valium, refuses xanax - says it makes her feel weird  7. NEW Diagnosis of HTN- will d/c on po metoprolol, diet and exercise. O/p follow up with pcp    Type of Admission: inpt  Medical Necessity for stay: acute diverticulitis, req iv abx      Date of Admission:   09/11/2013    Date of Discharge:   09/15/2013    Chief Complaint:      Chief Complaint   Patient presents with   . Rectal Bleeding       Discharge Diagnosis:   Hospital Problems:  Principal Problem:   *Diverticulitis  Active Problems:   Abdominal pain   Blood in stool   Back pain   Leukocytosis   Acute diverticulitis      Lists the present on admission hospital problems  Present on Admission:   . Diverticulitis  . Abdominal pain  . Blood in stool  . Back pain  . Leukocytosis  . Acute  diverticulitis        Consult Input/Plan     Plan  IP CONSULT TO ANESTHESIOLOGY    Procedures performed:   none  Physical Exam:    height is 1.702 m (5\' 7" ) and weight is 122.471 kg (270 lb). Her oral temperature is 98.6 F (37 C). Her blood pressure is 143/95 and her pulse is 91. Her respiration is 18 and oxygen saturation is 97%.   Body mass index is 42.28 kg/(m^2).  Filed Vitals:    09/15/13 0205 09/15/13 0533 09/15/13 1011 09/15/13 1207   BP: 133/70 132/68 157/85 143/95   Pulse: 71 70 91    Temp: 97.9 F (36.6 C) 97.5 F (36.4 C) 98.6 F (37 C)    TempSrc: Temporal Artery Temporal Artery Oral    Resp: 18 18 18     Height:       Weight:       SpO2: 96% 96% 97%      Intake and Output Summary (Last 24 hours) at Date Time    Intake/Output Summary (Last 24 hours) at 09/15/13 1237  Last data filed at 09/15/13 [phone]  Gross per 24 hour   Intake   2920 ml   Output    650 ml   Net   2270 ml         Labs:     Results     Procedure Component Value Units Date/Time    Stool culture [[id number]] Collected:09/13/13 0055    Specimen Information:Stool / Stool Updated:09/14/13 1305    Narrative:    ORDER#: [id number]                                    ORDERED BY: NEWCOMER, CHRIS  SOURCE: Stool                                        COLLECTED:  09/13/13 00:55  ANTIBIOTICS AT COLL.:                                RECEIVED :  09/13/13 06:25  Culture Stool                              FINAL       09/14/13 02:16  09/14/13   Absence of normal enteric flora             No Salmonella or Shigella isolated  Shiga Toxin Assay                          CANCELLED   09/14/13 13:04            - cancelled on 09/14/13 13:04   by  [id number]   Unable to complete testing.   No organisms isolated possibly due to antibiotic therapy.  Campylobacter Antigen Assay                FINAL       09/13/13 12:11  09/13/13   Negative for Campylobacter Antigen              Rads:   Radiological Procedure reviewed.  Ct Abd/pelvis With Iv Contrast    09/11/2013  Clinical  indication: Periumbilical pain. No prior studies for comparison.  TECHNIQUE: CT of the abdomen and pelvis with IV  contrast. Nonionic contrast was utilized.  FINDINGS: Minimal atelectasis at the lung bases. Small hiatal hernia is present.  The liver demonstrates decreased attenuation, consistent with fatty infiltration. Patient status post cholecystectomy. The spleen, adrenal glands pancreas and kidneys are normal except for subcentimeter hypodensity in the right kidney which may represent a cyst. Portal vein is patent. There is no biliary dilatation.  There is no bowel obstruction. Moderate stool is present in the colon. Normal appendix is visualized. Colonic diverticulosis, most prominent in the sigmoid colon. There is mild thickening of the distal sigmoid colon with pericolonic inflammatory changes, consistent with diverticulitis. There is no abscess or evidence of perforation. Trace free pelvic fluid is present. Bladder partially distended. No air in the bladder. Uterus is present. No adnexal masses.  Aorta is normal in caliber. There is no adenopathy. Degenerative changes are present in the lumbar spine. Post surgical changes in the lumbar spine.      09/11/2013   1. Distal sigmoid colon diverticulitis. No abscess or evidence of  perforation. 2. Other findings as above.  Jasmine December  D'Heureux, MD  09/11/2013 7:08 PM       Discharge Medications:     Please See Discharge Medication reconciliation for the final list of medications.    Pending Labs:   none  Discharge Destination:    home  Condition at Discharge :   Improved    Time spent for Discharge Care:    37 minutes    Follow-up:   Dr Jeanett Schlein GI in 1 week  Recommended Follow up with PCP in one week.    Signed by: Lana Fish, MD

## 2013-09-15 NOTE — Progress Notes (Signed)
Went over discharge instructions with pt gave 6 rxs, answered questions, removed iv catheter intact, pt was discharged home, walked out to lobby.

## 2013-09-15 NOTE — Progress Notes (Signed)
Please see detailed note done 2/16 . Pt was very anxious with elevated blood pressures so was monitered overnight. Pt did not feel well enough to be discharged.    Lana Fish

## 2013-09-15 NOTE — Plan of Care (Signed)
Problem: Pain  Goal: Patient's pain/discomfort is manageable  Outcome: Progressing  Pt has intermittent cramping, gave dilaudid as prn for pain, will continue to monitor.    Problem: Moderate/High Fall Risk Score >/=15  Goal: Patient will remain free of falls  Outcome: Progressing  Pt has non skid socks on, call bell within reach, will continue to monitor

## 2013-09-15 NOTE — Progress Notes (Signed)
GASTROENTEROLOGY PROGRESS NOTE  [facility]  [facility]  [phone]    Date Time: 09/15/2013 2:11 PM  Patient Name: [facility]        Assessment:     Acute diverticulitis improved  H/o recurrent episodes requiring admissions    Plan:   D/c home  PO abx for 2 weeks  Low residue diet for 1 week followed by high fiber diet  Office follow up 2 weeks  Colonoscopy in 6 weeks      Subjective:   Less abdominal pain, having bm, tolerating diet      Endoscopy:     EGD:  -  Colonoscopy:   ERCP:   -    Medications:     Current Facility-Administered Medications   Medication Dose Route Frequency   . ciprofloxacin  400 mg Intravenous Q12H SCH   . metoprolol  25 mg Oral Q12H SCH   . metroNIDAZOLE  500 mg Intravenous Q8H SCH   . pantoprazole  40 mg Oral Daily       Review of Systems:   A comprehensive review of systems was obtained from the patient  The patient denies any additional changes to their otic, opthalmologic, dermatologic, pulmonary, cardiac, genitourinary, musculoskeletal, hematologic, constitutional, or psychiatric systems.    Physical Exam:     Filed Vitals:    09/15/13 1207   BP: 143/95   Pulse:    Temp:    Resp:    SpO2:        General appearance - alert, in no distress,  oriented to person, place, and time  Eyes - pupils equal and reactive, sclera anicteric  Chest - clear to auscultation, no wheezes, rales or rhonchi  Heart - normal rate, regular rhythm  Abdomen - soft, mild tenderness LLQ, nondistended, no masses or organomegaly, no guarding, no rebound, no peritoneal signs  Extremities - no pedal edema noted      Labs:     Results     ** No Results found for the last 24 hours. **            Radiology:   Radiological Procedure reviewed.    Signed by Nani Skillern

## 2013-09-16 NOTE — Progress Notes (Signed)
Case Management Facility [place] Checklist    Name of Receiving Facility and Bed #:   home     Number for Floor RN to Call Report:    n/a   Name and number of discharge nurse and time nurse notified:    Family Member notified of transfer plan: Patient/Family/POA notified of transfer plan: Yes     Fax number of Receiving Facility: n/a   Hard Copy of any narcotic prescriptions in envelope to travel with the patient:       Hard Copy of Durable DNR and/or advanced directive in envelope to travel with the patient:       Phone number and name of attending physician at receiving facility (MD to MD handoff): n/a           Transportation  Mode and Time of Transportation: Mode of Transportation: Car         SNF Only  I have completed the Medicaid Pre- Screening ([place], DMAS 96 & 97) and faxed it via [employer]/Allscripts to the receiving facility.    n/a   I have completed the DMAS 95 - [place]/MR form and faxed it via ECIN/Allscripts to the receiving facility.       Does the DMAS 95 - [place]/MR trigger a Level 2 screening?         Medicare Only  I have validated that this patient has a 3 midnight inpatient qualifying stay (SNF only).  n/a     I have validated that the patient has received the second Important Message from Medicare (IMM) letter. If LOS 3 days or greater, did patient received 2nd IMM Letter?: Yes       The following was routed via Epic to the Receiving Facility:  Document Routed via Epic on Day of Discharge?   History & Physical n/a   Discharge Medication Reconciliation (.DCMEDLIST in note)    D/C order    Last day of PT/OT notes (if applicable)    D/C Summary    PICC Line Report (if applicable)    Most Recent Chest Xray    All MD Consults    Isolation Requirement (go to isolation order in other orders section of chart review)    Urine/Wound/Blood Culture Results     Discharge Instructions from AVS (use Pasteboard option to copy & paste from AVS into a progress note)    Tube Feeding or TPN Order (rate & formula) if applicable     Last 3 days of MD Progress Notes

## 2019-10-07 IMAGING — MR MRI CERVICAL SPINE WITHOUT CONTRAST
8 series · 41 of 48 positions shown · IV contrast (gadolinium)
Comparison: None

MRI CERVICAL SPINE WITHOUT CONTRAST, 10/07/2019 [DATE]: 
CLINICAL INDICATION: Neck pain extending into left arm
TECHNIQUE: Sagittal T1, Sagittal T2, Sagittal STIR, Axial TSE and Axial [id number] 
images of the cervical spine were performed without intravenous gadolinium 
enhancement.

[Series 101: survey · axial · 10.0mm · 0.94mm/px · z∈[-15,+150]mm · 3 of 9 slices shown (1 of 2)]
[im 1/9]
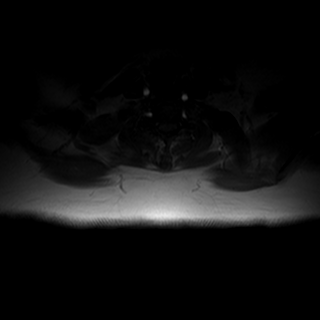
[im 5/9]
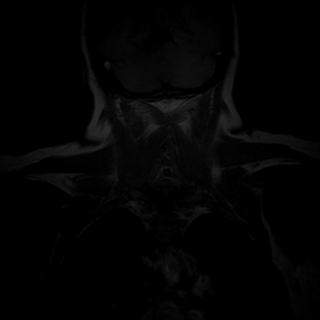
[im 9/9]
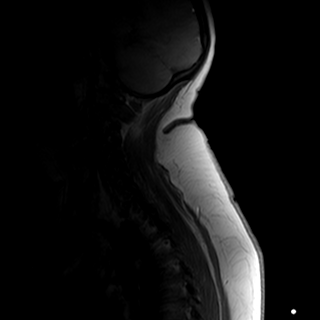

[Series 301: survey · axial · 10.0mm · 0.94mm/px · z∈[-15,+151]mm · 3 of 9 slices shown (2 of 2)]
[im 1/9]
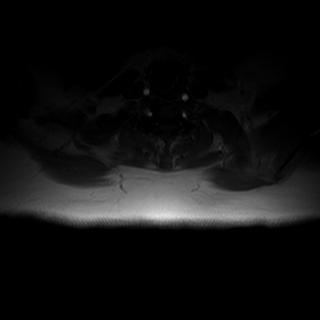
[im 5/9]
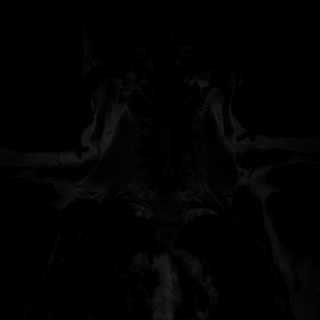
[im 9/9]
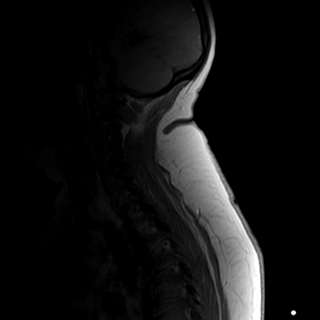

[Series 401: t1w_tse sag · sagittal · 3.0mm · 0.53mm/px · 4 of 15 slices shown]
[im 1/15]
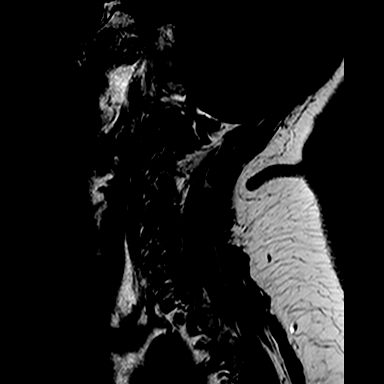
[im 5/15]
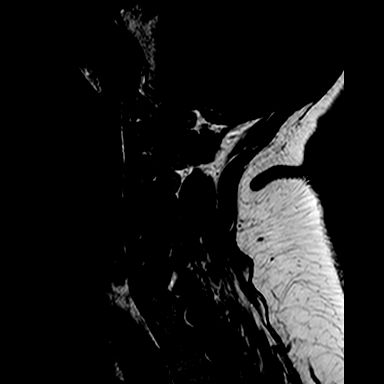
[im 10/15]
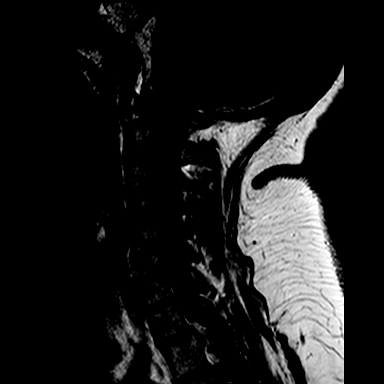
[im 15/15]
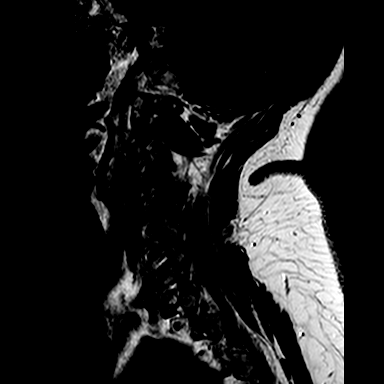

[Series 601: stir_longte sag · sagittal · 3.0mm · 0.63mm/px · 4 of 15 slices shown]
[im 1/15]
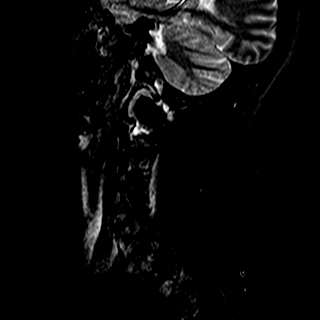
[im 5/15]
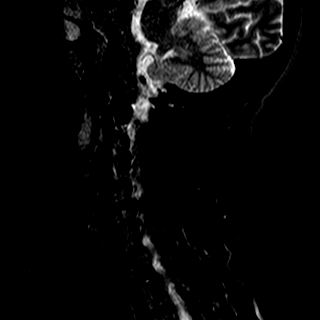
[im 10/15]
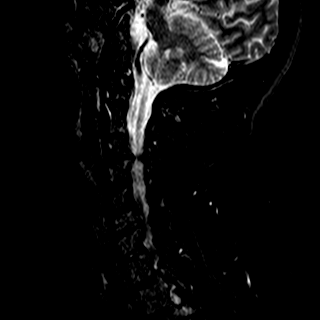
[im 15/15]
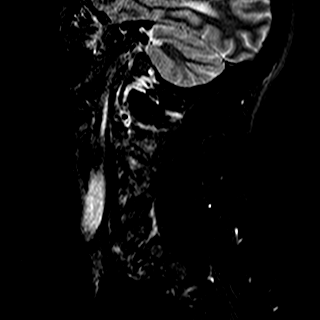

[Series 701: t2w_tse ax · axial · 3.0mm · 0.44mm/px · z∈[-84,+16]mm · 8 of 36 slices shown]
[im 1/36]
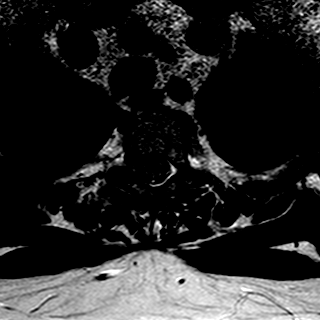
[im 4/36]
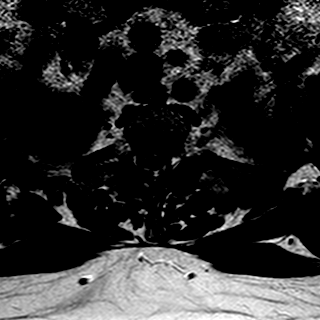
[im 12/36]
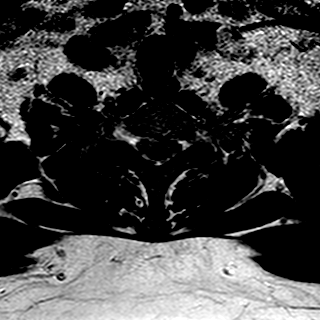
[im 16/36]
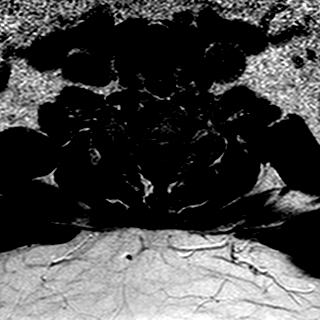
[im 20/36]
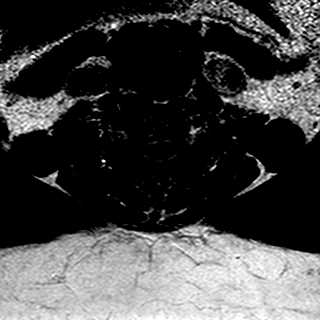
[im 24/36]
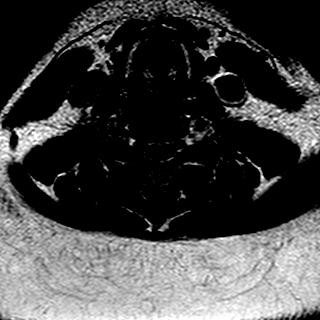
[im 32/36]
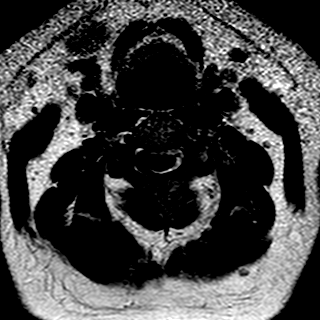
[im 36/36]
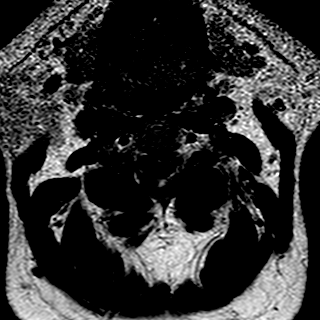

[Series 801: t2w_ffe_(id) · axial · 3.0mm · 0.44mm/px · z∈[-33,+16]mm · 8 of 36 slices shown (1 of 2)]
[im 1/36]
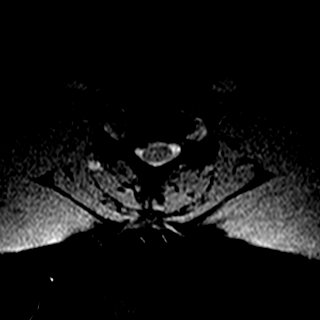
[im 4/36]
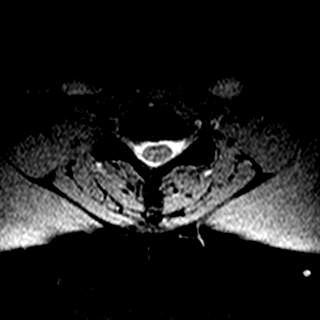
[im 12/36]
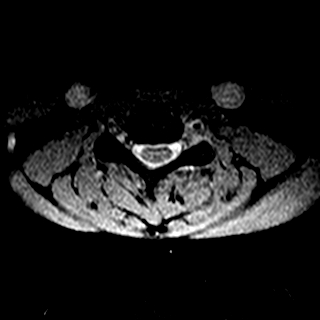
[im 16/36]
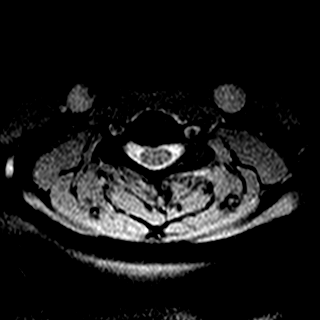
[im 20/36]
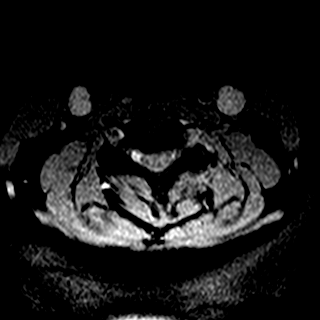
[im 24/36]
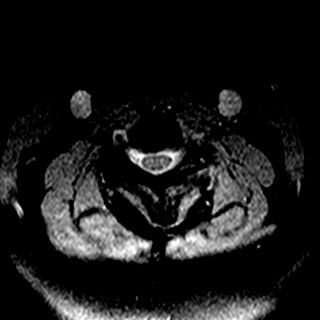
[im 32/36]
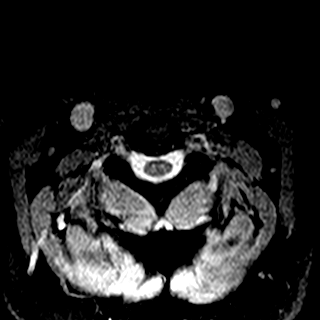
[im 36/36]
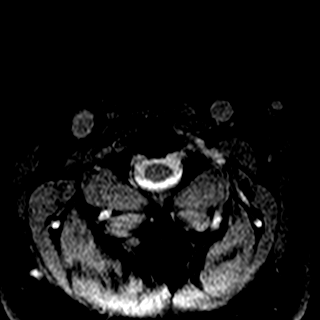

[Series 901: t2w_ffe_(id) · axial · 3.0mm · 0.44mm/px · z∈[-79,-30]mm · 8 of 36 slices shown (2 of 2)]
[im 1/36]
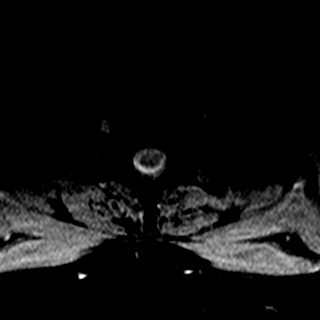
[im 4/36]
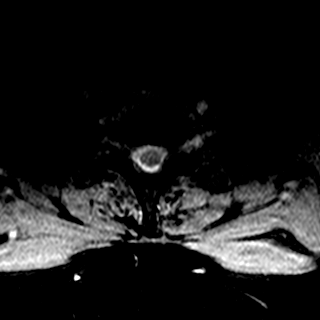
[im 12/36]
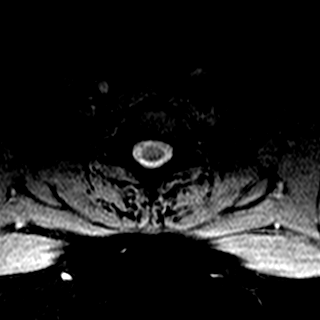
[im 16/36]
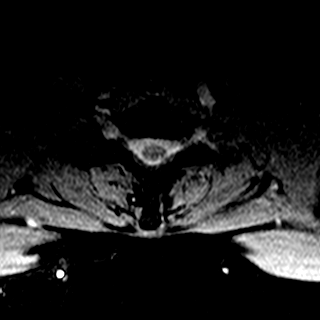
[im 20/36]
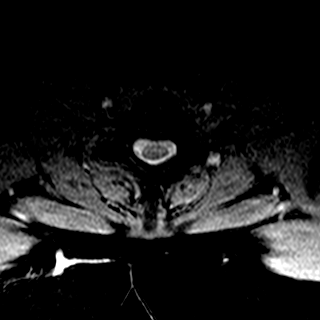
[im 24/36]
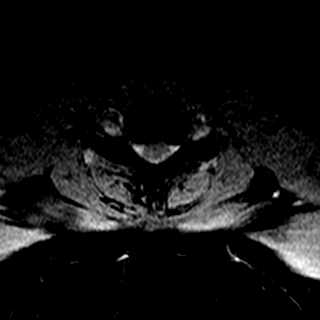
[im 32/36]
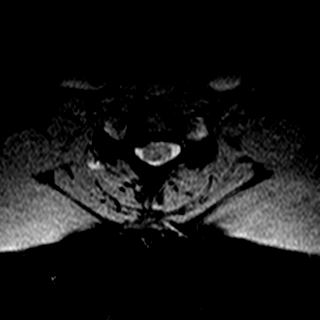
[im 36/36]
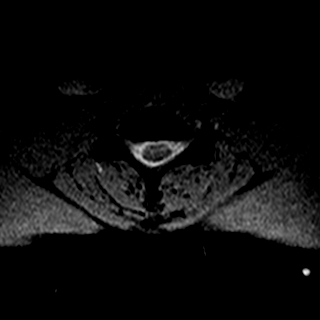

[Series 1001: t2w_tse sag · sagittal · 3.0mm · 0.52mm/px · 3 of 15 slices shown]
[im 1/15]
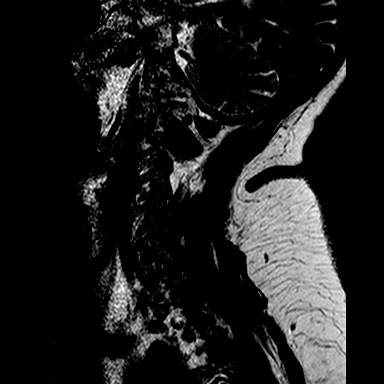
[im 5/15]
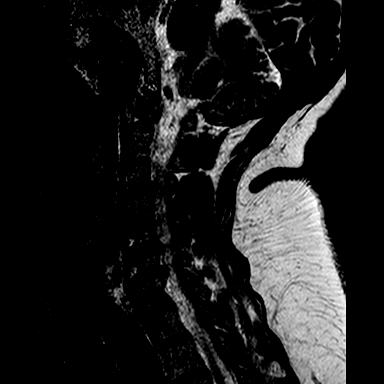
[im 10/15]
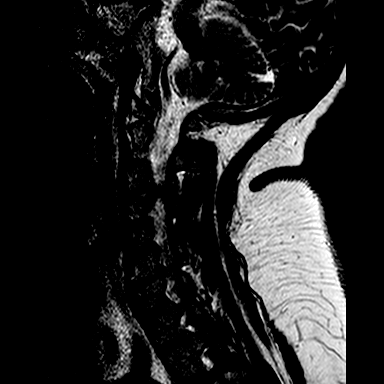

[41 of 48 positions shown; findings below may reference images not displayed]

FINDINGS: Cervical vertebral heights are intact. There is spondylosis 
superimposed on short posterior elements. There is marked disc narrowing at 
C6-7, moderate to marked at C3-4 and C5-6. Allowing for mild motion artifact 
cord signal appears normal. Sagittal STIR images show reactive degenerative 
endplate edema along the posterior C6-7 interspace.. The craniocervical junction 
is open. The dens and atlanto-dental interval are intact. Lower posterior fossa 
contents are unremarkable. There is no evidence for recent fracture or spinal 
malignancy. There are zygapophyseal facet degenerative changes. 
At C2-3 the canal is borderline narrow. Foramina are open. 
At C3-4 there is posterior disc-osteophyte producing deformity of the ventral 
cord. Canal diameter is 7 mm, moderate stenosis. Foramina are open. 
At C4-5 the disc margin is intact. Canal diameter is 9.5 mm, at the lower range 
of normal. Foramina are open. 
At C5-6 there is mild disc-osteophyte, mildly effacing the right ventral cord as 
seen on axial images 18/19. This impinges the exiting right C6 nerve root. There 
is moderate to marked right foraminal stenosis due to uncovertebral spurring. 
Left foramen is open. 
At C6-7 there is disc-osteophyte, mildly effacing the ventral cord, with canal 
diameter 7 mm. There is moderate right, mild left foraminal stenosis. 
At C7-T1 the canal and foramina are open.
IMPRESSION: Spondylosis superimposed on short posterior elements. Disc-osteophyte deforms 
the ventral cord at C3-4 with moderate canal stenosis. 
At C5-6 there is right posterolateral disc/osteophyte deforming the right 
ventral cord, in combination with foraminal stenosis impinging the right C6 
nerve root. 
At C6-7 disc-osteophyte produces deformity of the ventral cord with moderate 
canal stenosis and right foraminal stenosis encroaching on the exiting right C7 
nerve root. Degenerative endplate reactive change along the posterior C6-7 
interspace. 
There is no evidence for fracture, cord signal abnormality, or spinal 
malignancy.

## 2020-01-08 IMAGING — CT CT LUMBAR SPINE WITHOUT CONTRAST
3 of 5 series · 12 of 33 positions shown, 13 images · non-contrast
Comparison: There are no previous exams available for comparison.

CT LUMBAR SPINE WITHOUT CONTRAST, 01/08/2020 [DATE]: 
CLINICAL INDICATION: Spondylosis without myelopathy 
A search for DICOM formatted images was conducted for prior CT imaging studies 
completed at a non-affiliated media free facility.
TECHNIQUE: The lumbar spine was scanned from T12 through mid-sacrum without 
contrast on a high-resolution CT scanner using dose reduction techniques. 
RoutIne MPR reconstructions were performed.

[Series 3: l spine 2.0 b31s · axial · 0.30mm/px · z∈[-580,-424]mm · 4 of 118 slices shown, 5 images]
[im 20/118  soft-tissue]
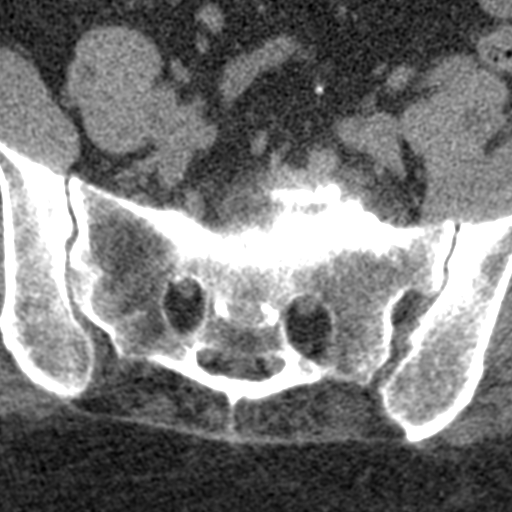
[im 20/118  bone]
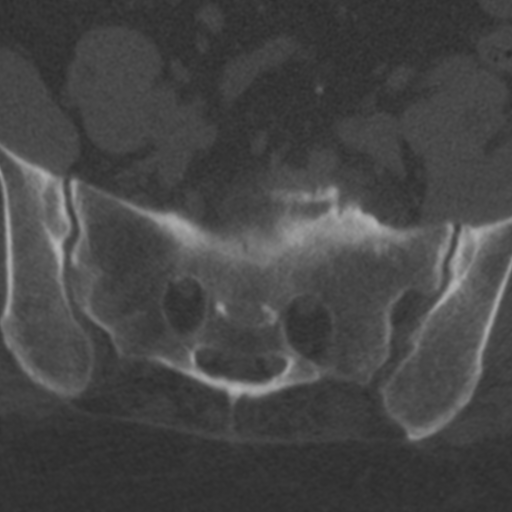
[im 40/118  bone]
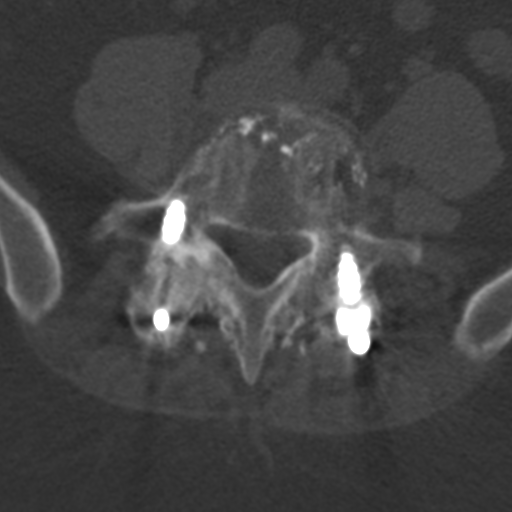
[im 79/118  bone]
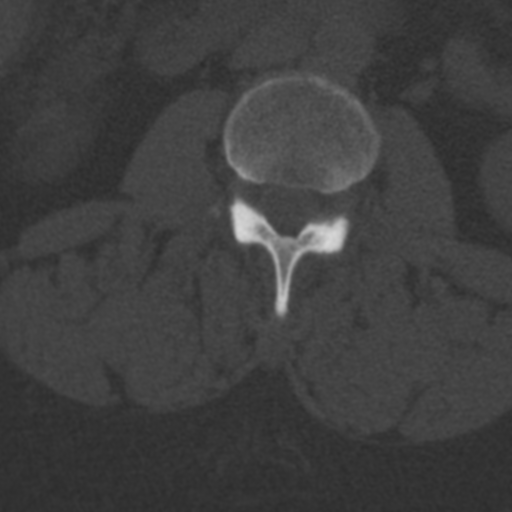
[im 98/118  bone]
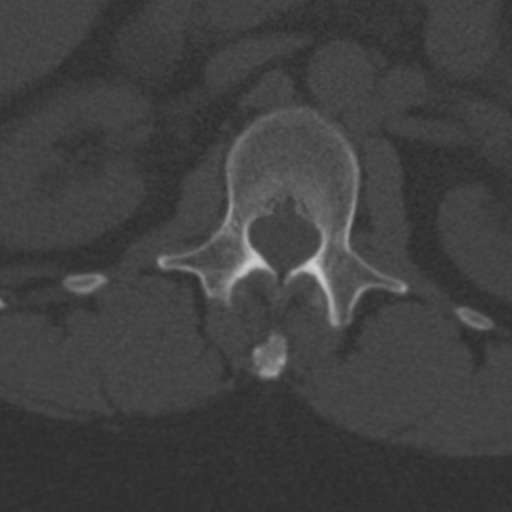

[Series 7: l spine coronal · coronal · 0.31mm/px · 3 of 68 slices shown]
[im 14/68  bone]
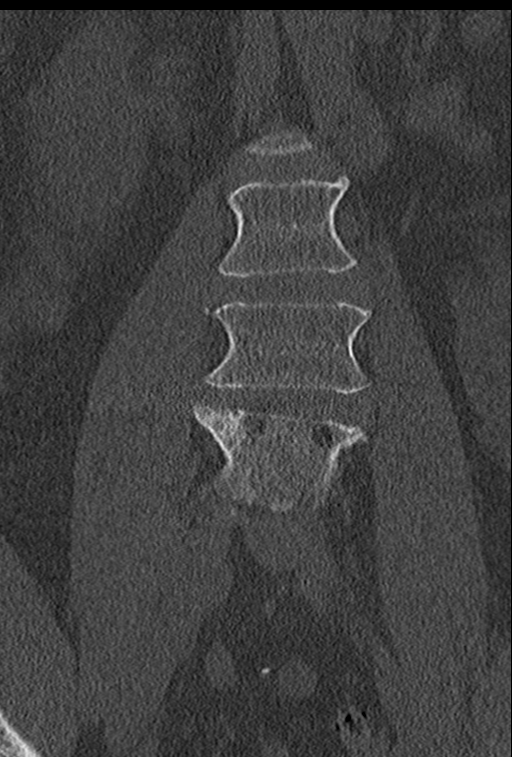
[im 27/68  bone]
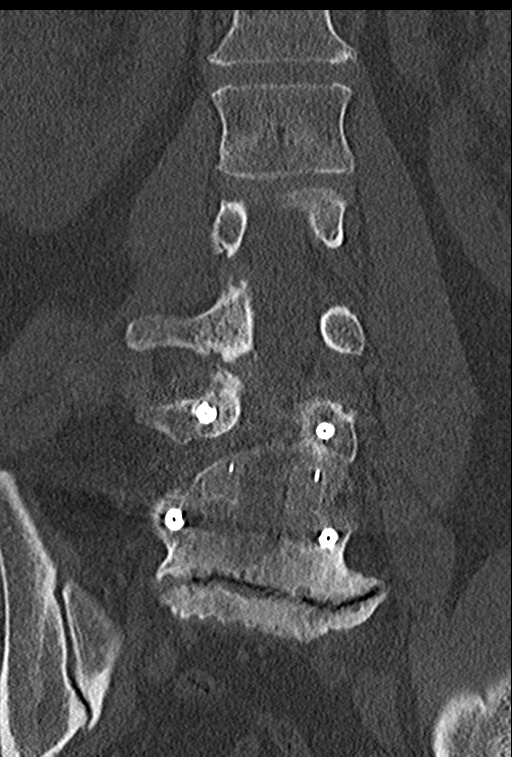
[im 41/68  bone]
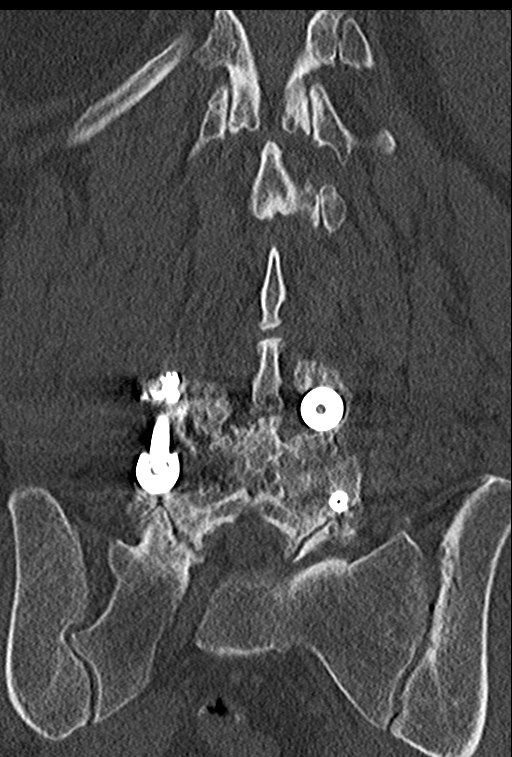

[Series 8: l spine sag · sagittal · 0.31mm/px · 5 of 74 slices shown]
[im 13/74  bone]
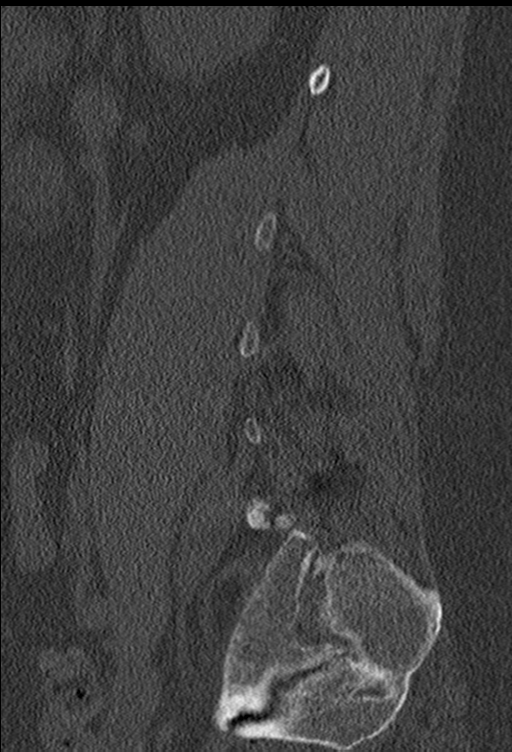
[im 25/74  bone]
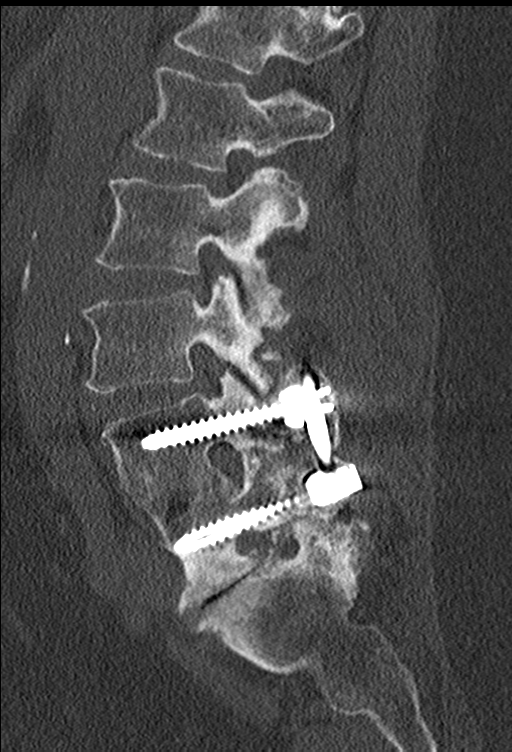
[im 37/74  bone]
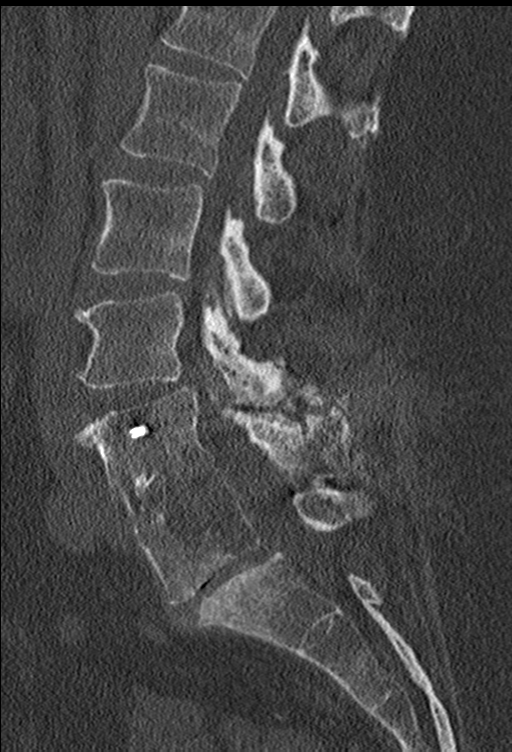
[im 49/74  bone]
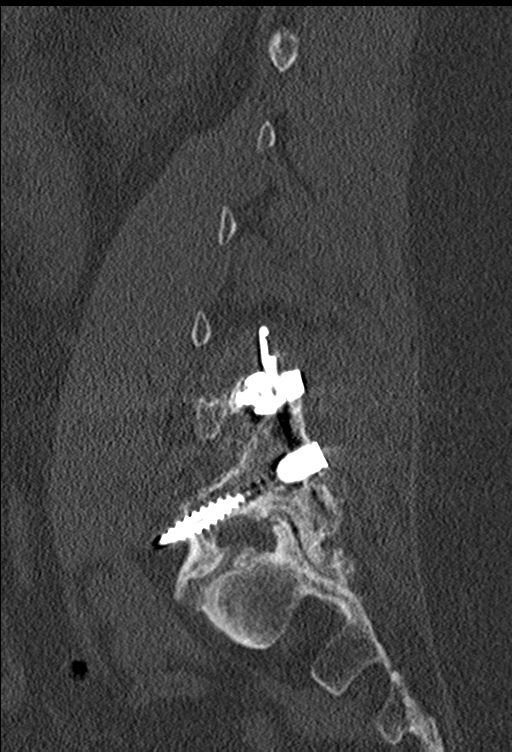
[im 61/74  bone]
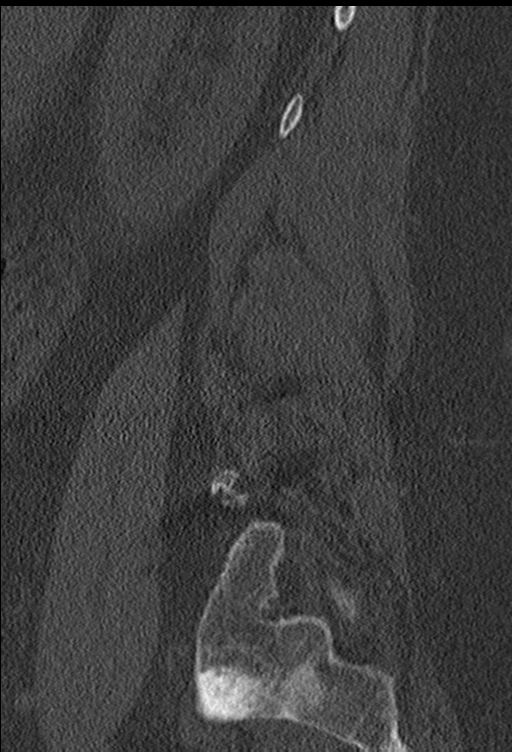

[12 of 33 positions shown; findings below may reference images not displayed]

FINDINGS: Lumbar vertebral heights are intact. Patient is status post bilateral 
pedicle screw and interbody disc cage fusion at L4-5. Hardware appears 
appropriately positioned, although the left L5 pedicle screw approximates the 
inferior pedicular cortex, coronal image 33, without transgression of the neural 
foramen. The left L4 pedicle screw encroaches on the inferomedial patella 
cortex, axial image 67, coronal image 29 without transgressing the neural 
foramen. There is no evidence for hardware failure or loosening. There is 
posterior element fusion bone which appears solid. 
At L5-S1 there is slight anterolisthesis. The canal is open. There are marked 
facet changes and moderate bilateral foraminal stenosis. 
At L4-5 there has been left laminectomy. The canal is open. Mild right foraminal 
stenosis. 
At L3-4 there appears to be marked facet change with ligamentous thickening and 
broad-based disc bulge contributing to moderate canal stenosis. Soft tissue 
detail is limited by metal artifact. There is moderate bilateral foraminal 
stenosis due to lateral annular bulging. 
At L2-3 the canal and foramina appear open. 
At L1-2 the canal and foramina are open.
IMPRESSION: Status post L4-5 fusion. Please see comments above regarding left-sided pedicle 
screws. 
Degenerative changes at L3-4. There appears to be moderate canal stenosis and 
moderate bilateral foraminal stenosis. Soft tissue detail is limited. MRI would 
be useful if indicated. 
There is slight anterolisthesis of L5 on S1 with moderate to marked disc 
narrowing, and moderate bilateral foraminal stenosis. The canal appears open at 
L5-S1. 
No evidence for fracture. No bone destruction to indicate infection or 
malignancy. 
Mild lumbar levoscoliosis. 
Mild to moderate SI joint degenerative changes. 
RADIATION DOSE REDUCTION: All CT scans are performed using radiation dose 
reduction techniques, when applicable.  Technical factors are evaluated and 
adjusted to ensure appropriate moderation of exposure.  Automated dose 
management technology is applied to adjust the radiation doses to minimize 
exposure while achieving diagnostic quality images.

## 2020-01-23 IMAGING — MR MRI LUMBAR SPINE WITHOUT CONTRAST
4 of 7 series · 17 of 48 positions shown · IV contrast (gadolinium)
Comparison: CT lumbar spine January 08, 2020

MRI LUMBAR SPINE WITHOUT CONTRAST, 01/23/2020 [DATE]: 
CLINICAL INDICATION: Low back pain
TECHNIQUE: Sagittal T1, Sagittal T2, Sagittal STIR, Axial T1 and Axial T2 MR 
images of the lumbar spine were performed without intravenous gadolinium 
enhancement.

[Series 101: survey · axial · 10.0mm · 1.39mm/px · z∈[-15,+199]mm · 2 of 9 slices shown]
[im 1/9]
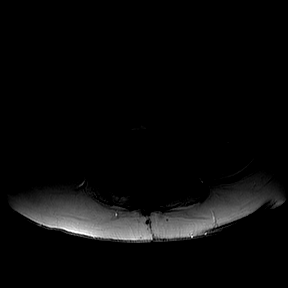
[im 9/9]
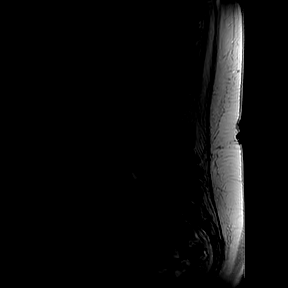

[Series 201: t2w_cor-surv · coronal · 6.0mm · 0.50mm/px · 2 of 5 slices shown]
[im 1/5]
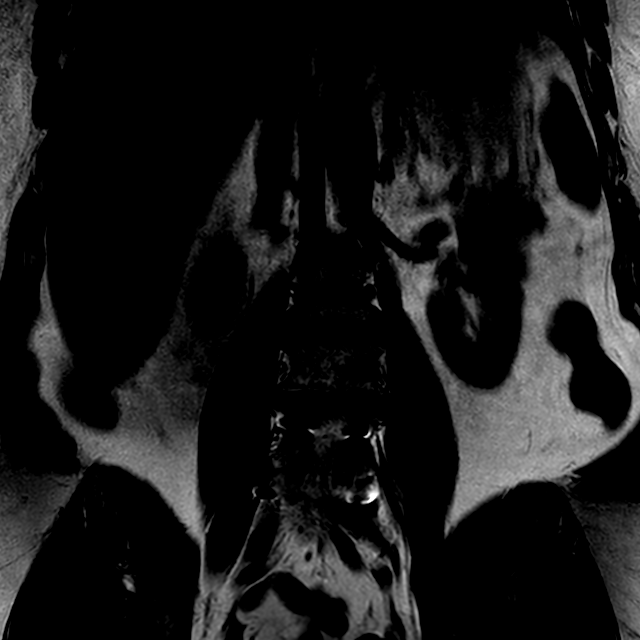
[im 5/5]
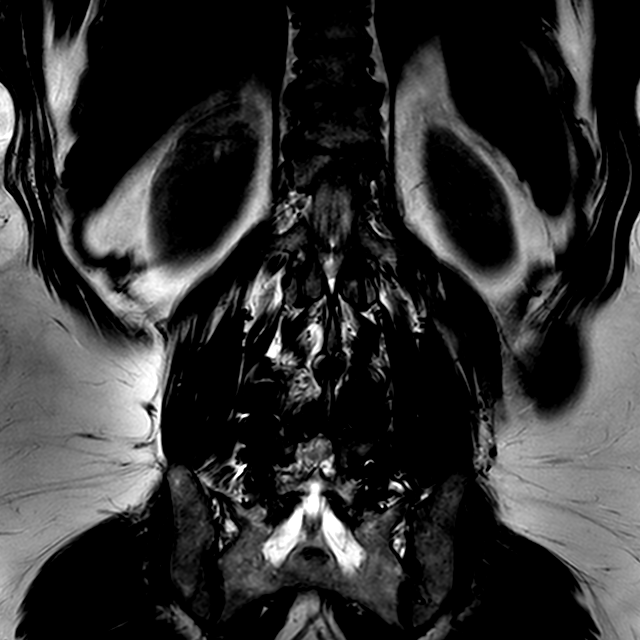

[Series 301: (person_name)1(person_name)_(person_name) · sagittal · 4.0mm · 0.37mm/px · 4 of 17 slices shown]
[im 1/17]
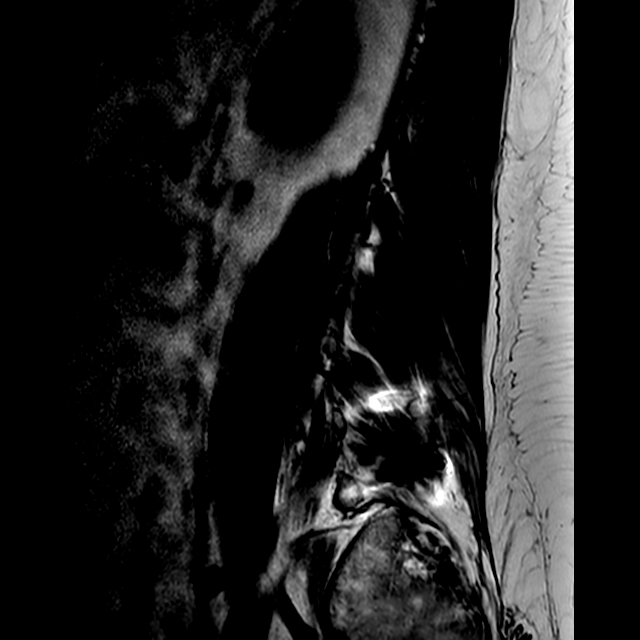
[im 4/17]
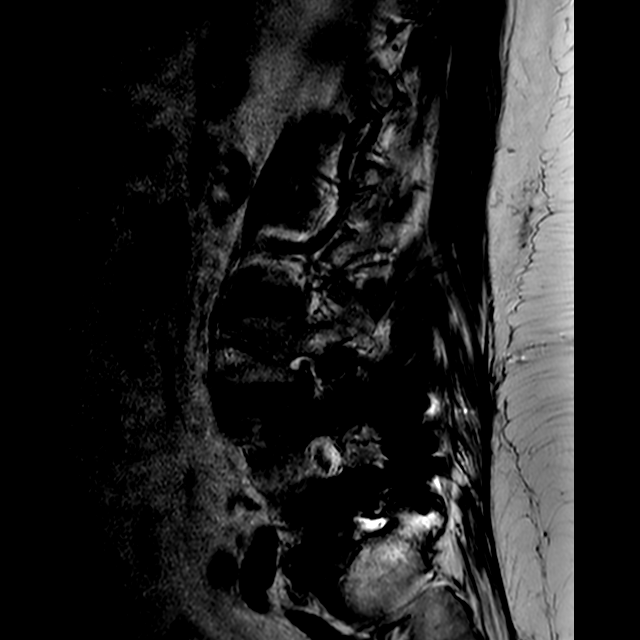
[im 10/17]
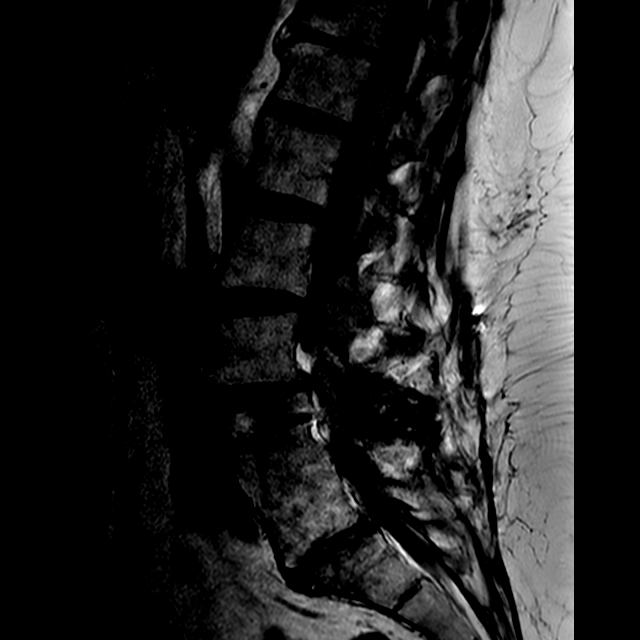
[im 17/17]
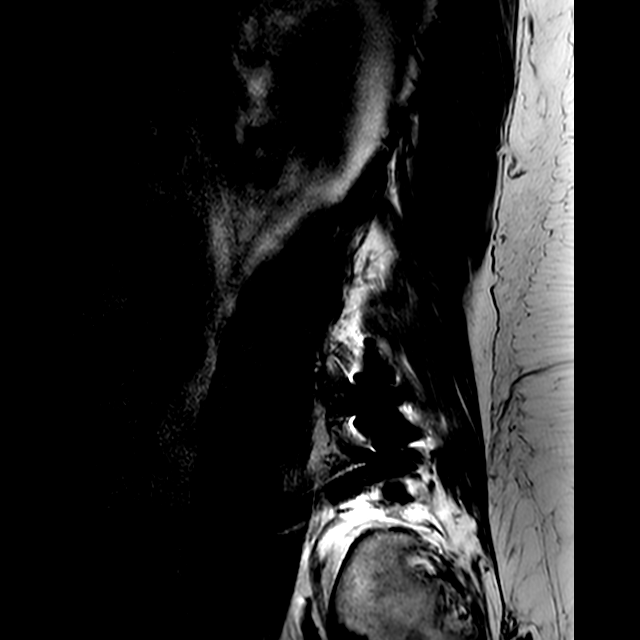

[Series 701: T1 · axial · 4.0mm · 0.38mm/px · z∈[-115,+126]mm · 9 of 42 slices shown]
[im 1/42]
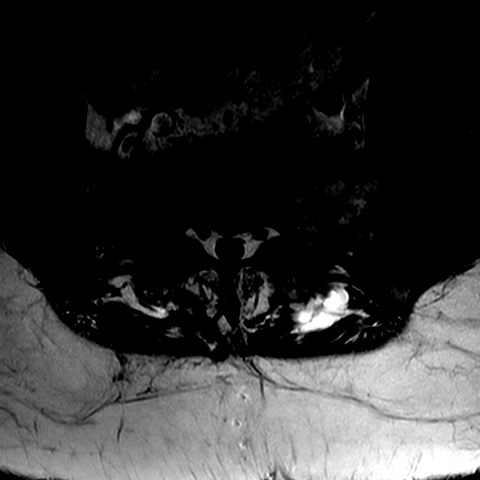
[im 6/42]
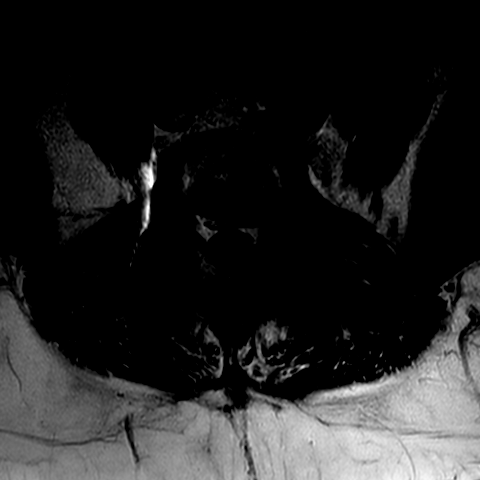
[im 12/42]
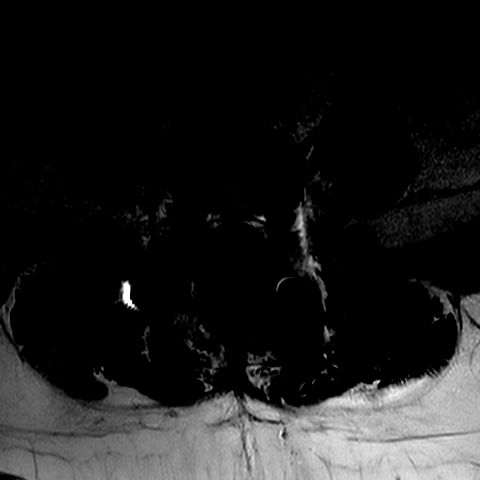
[im 18/42]
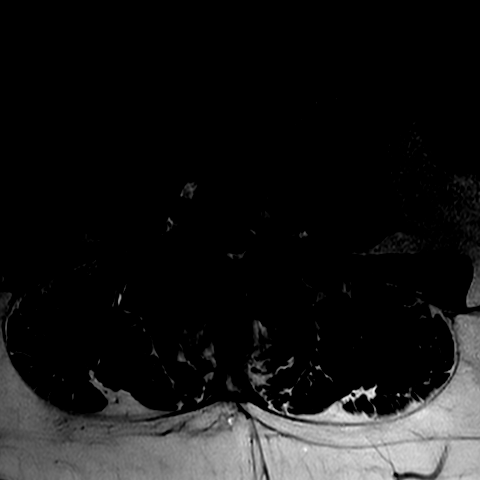
[im 21/42]
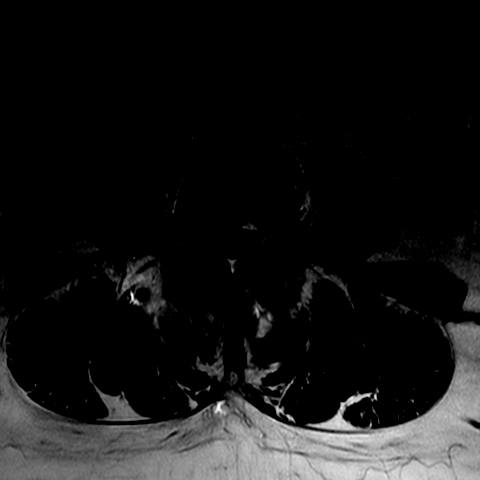
[im 24/42]
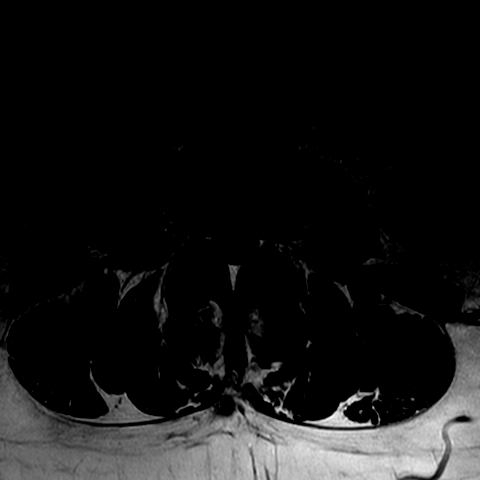
[im 30/42]
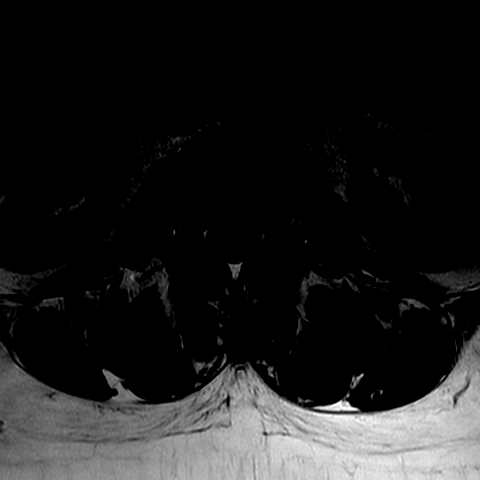
[im 36/42]
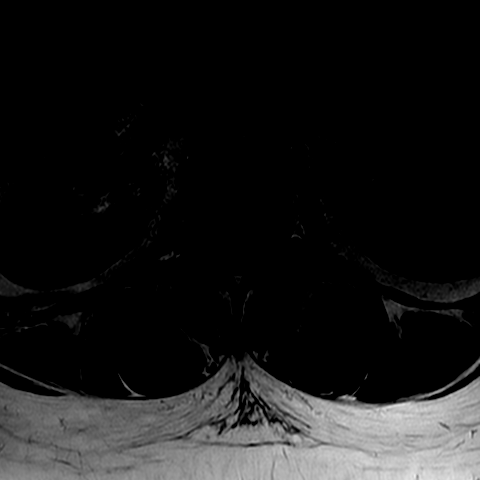
[im 42/42]
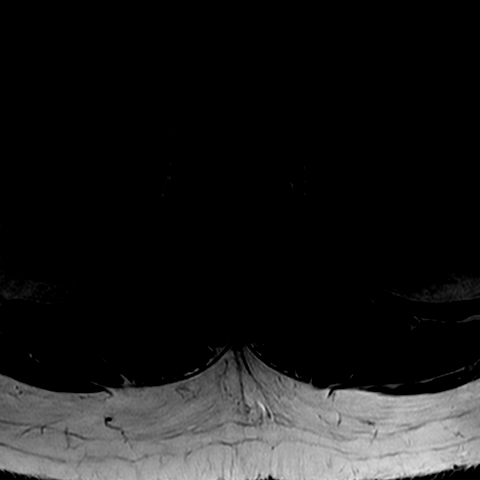

[17 of 48 positions shown; findings below may reference images not displayed]

FINDINGS: Lumbar vertebral heights are intact. The patient is status post 
bilateral pedicle screw and interbody disc cage fusion at L4-5. Allowing for 
susceptibility artifact hardware appears appropriately positioned. Is no 
evidence for fracture or spinal malignancy. The conus is normal.  Visualized 
sacrum is intact. 
At L5-S1 there is less than grade 1 anterolisthesis. There is moderate to marked 
disc narrowing. The canal is open. There are marked facet changes. There appears 
to be bilateral foraminal stenosis encroaching on the distal L5 nerve roots, 
worse on the left. 
At L4-5 the canal is open. There has been decompression. Foramina appear open. 
At L3-4 there is broad based disc bulge and moderate to marked facet changes 
ligament is thickening. There is moderate-marked canal stenosis, worse on the 
right, axial T2 image 12. There is mild to moderate right, mild left foraminal 
stenosis. There is slight anterolisthesis at this level measuring approximately 
3 mm. 
At L2-3 there is 1 mm retrolisthesis. There is borderline-mild canal stenosis 
due to moderate to marked facet changes ligament is thickening and mild dorsal 
epidural lipomatosis. There is mild right foraminal narrowing. 
At L1-2 the canal and foramina are open. 
T12-L1 is unremarkable. 
At T11-12 there is marked disc narrowing with mild disc bulge, slightly 
separated from the cord.
IMPRESSION: Moderate to marked canal stenosis at L3-4. There is approximately 3 mm 
degenerative anterolisthesis at this level. Findings similar to the prior CT. 
Stable L4-5 fusion. 
Less than grade 1 anterolisthesis of L5 on S1. There is left greater than right 
foraminal stenosis impinging the distal left L5 nerve root. 
No evidence for fracture, spondylodiscitis, or malignancy.

## 2020-01-25 IMAGING — MR MRI THORACIC SPINE WITHOUT CONTRAST
5 of 8 series · 17 of 48 positions shown · non-contrast
Comparison: Cervical MRI October 07, 2019

MRI THORACIC SPINE WITHOUT CONTRAST, 01/25/2020 [DATE]: 
CLINICAL INDICATION: Back pain, left arm tingling
TECHNIQUE: Sagittal T1, Sagittal T2, Sagittal STIR, Axial T2 and Axial T1 MR 
images of the thoracic were performed without intravenous contrast enhancement.

[Series 101: survey · axial · 10.0mm · 1.67mm/px · z∈[-30,+199]mm · 3 of 15 slices shown]
[im 1/15]
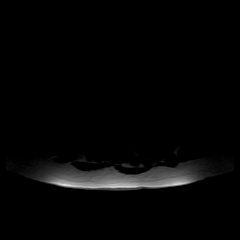
[im 8/15]
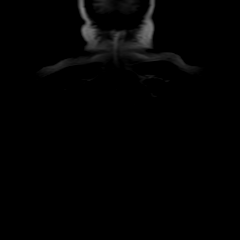
[im 15/15]
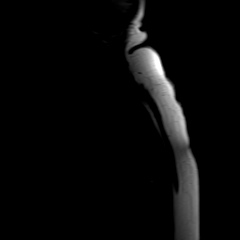

[Series 201: t2w_cor-surv · coronal · 10.0mm · 0.88mm/px · 3 of 10 slices shown]
[im 1/10]
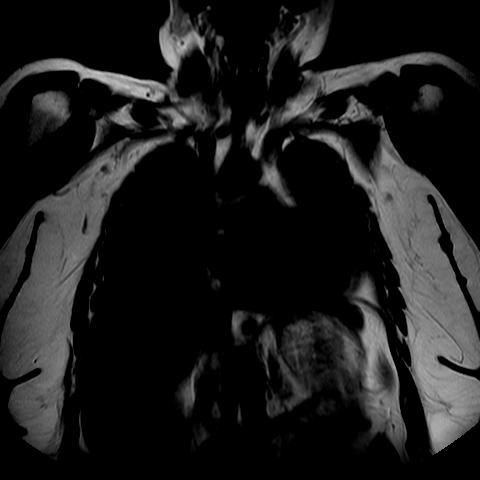
[im 5/10]
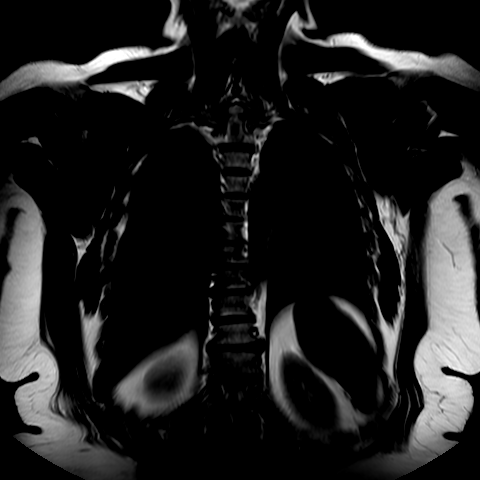
[im 10/10]
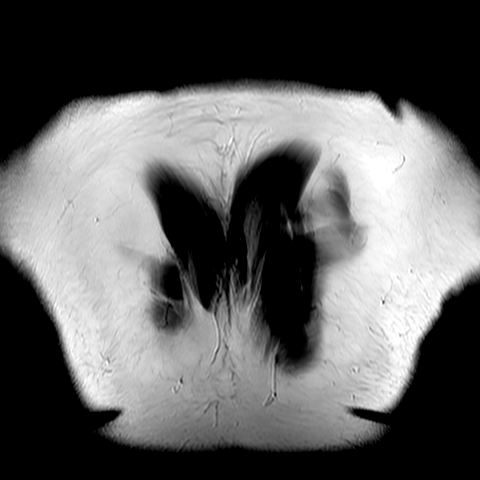

[Series 303: T1 · sagittal · 5.5mm · 0.66mm/px · 4 of 15 slices shown]
[im 1/15]
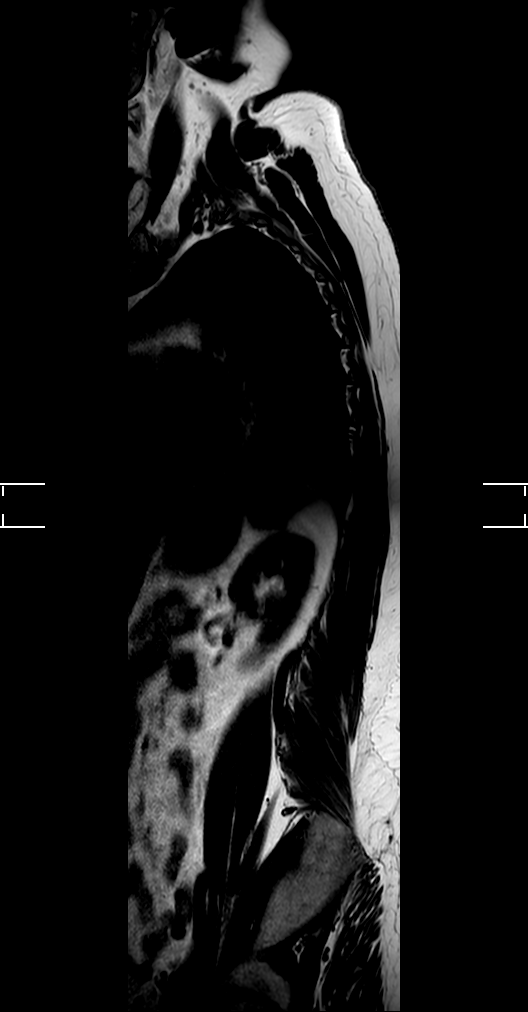
[im 5/15]
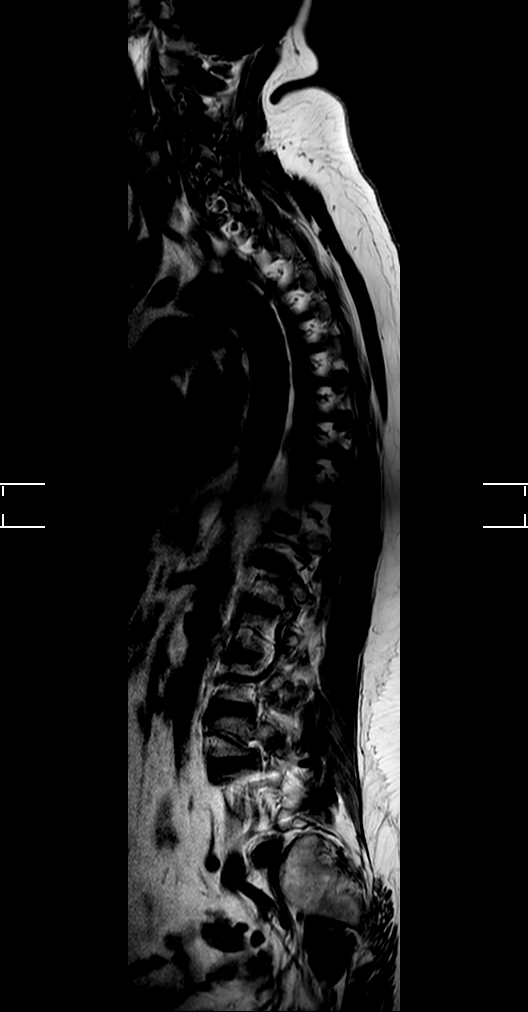
[im 10/15]
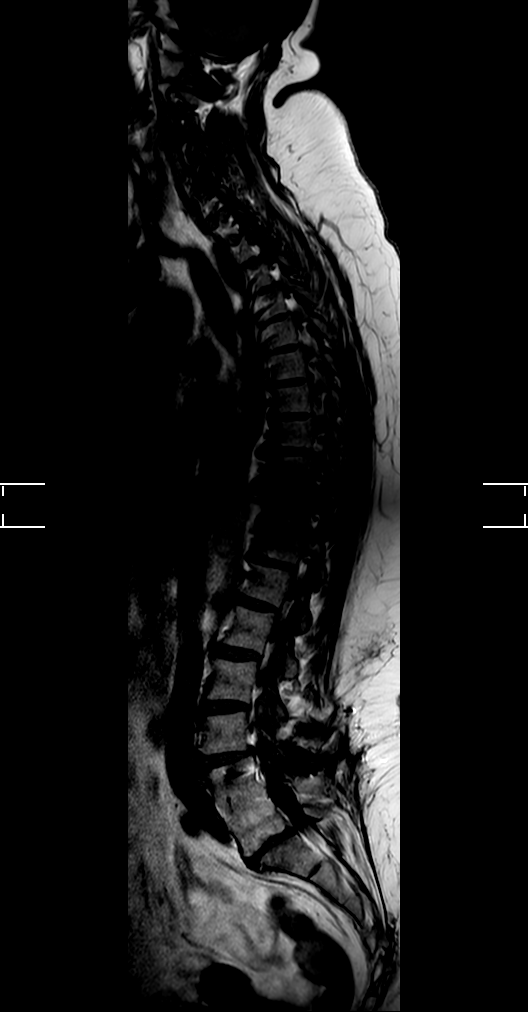
[im 15/15]
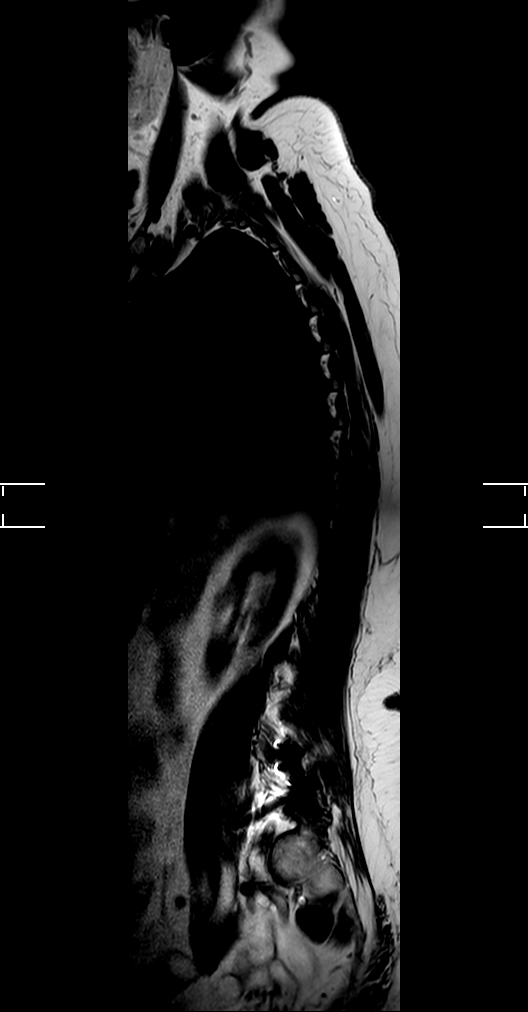

[Series 401: t1_tse_sag · sagittal · 3.0mm · 0.62mm/px · 6 of 21 slices shown]
[im 1/21]
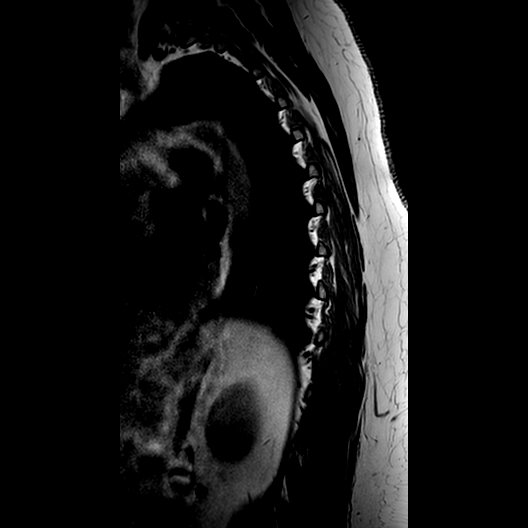
[im 5/21]
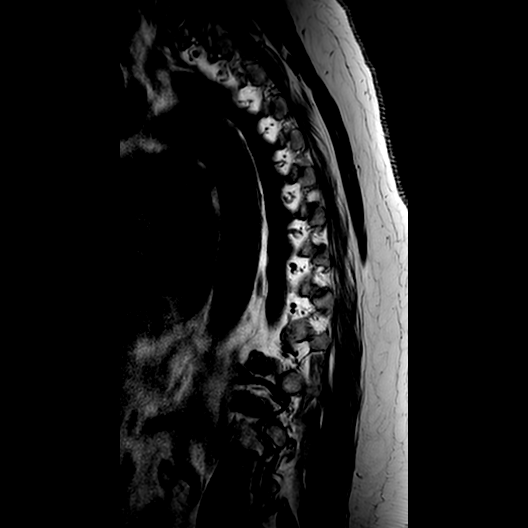
[im 9/21]
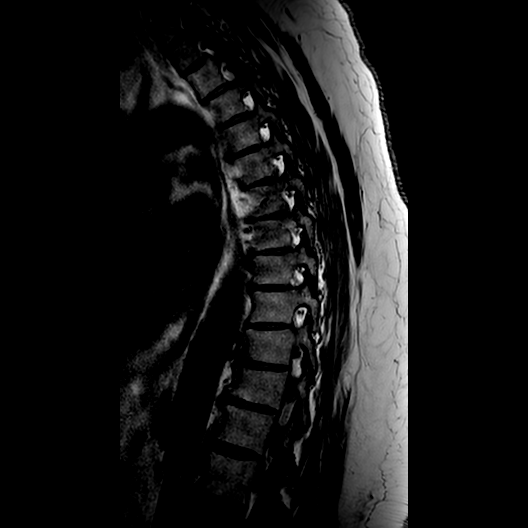
[im 13/21]
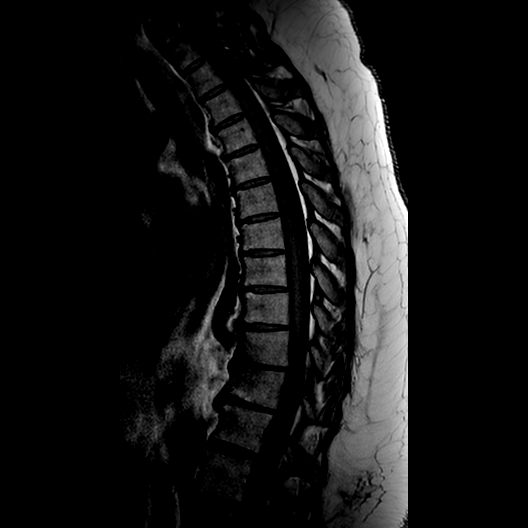
[im 17/21]
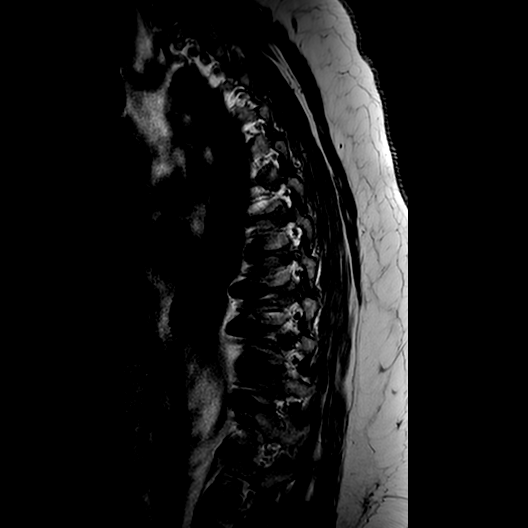
[im 21/21]
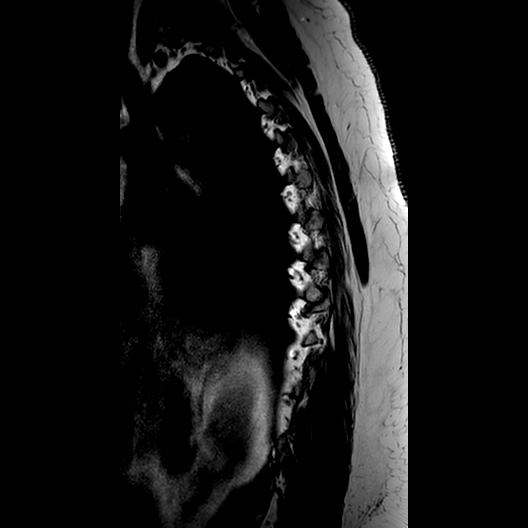

[Series 501: t2_tse_sag · sagittal · 3.0mm · 0.52mm/px · 1 of 21 slices shown]
[im 1/21]
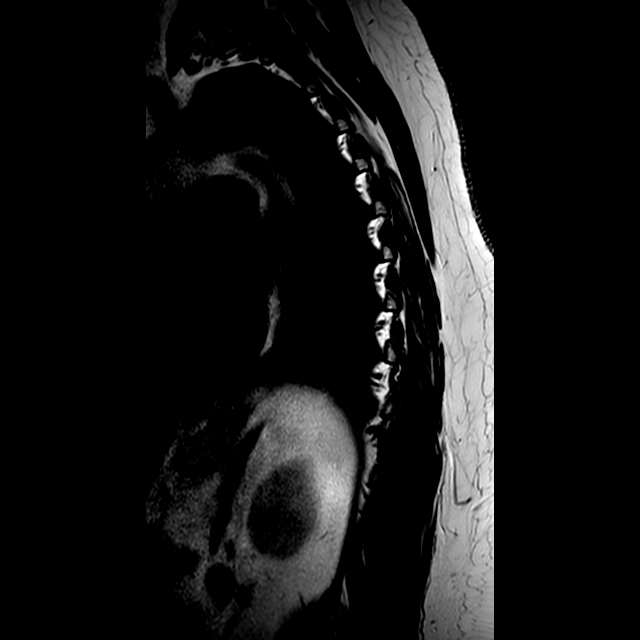

[17 of 48 positions shown; findings below may reference images not displayed]

FINDINGS: The study is mildly limited by motion artifact. Thoracic vertebral 
heights are intact. There is no evidence for fracture. There is moderate 
degenerative reactive edema along the anterior T10-11 interspace. There is 
marked disc narrowing at this level. There is moderate to marked narrowing 
elsewhere throughout the mid to lower thoracic spine. 
Allowing for motion artifact there is no intramedullary signal abnormality. At 
T10-11 there is mild disc bulge touching the cord without significant deformity. 
There is 2.5 mm CSF dorsal to the cord at this level, with canal diameter in the 
midline 8 mm, mildly stenotic. There is disc bulging elsewhere, including 
T11-12, T7-8 and T6-7, not touching the cord. 
Thoracic foramina appear open. Limited views of the mediastinum show no 
adenopathy. There is no layering pleural fluid. There is no paraspinal mass. 
Coronal localizer shows mild thoracic dextrocurvature.
IMPRESSION: Degenerative changes, most pronounced at T10-11 where disc bulge touches the 
ventral cord and contributes to mild canal stenosis. There is degenerative 
reactive edema along the anterior T10-11 interspace. 
Less pronounced degenerative changes elsewhere without cord contact or 
significant stenosis. 
No evidence for fracture or malignancy. No paraspinal mass. No cord signal 
abnormality. 
Mild thoracic dextroscoliosis. 
Localizer view shows cervical spondylosis. There is mild cord deformity at C3-4, 
possibly C6-7, similar to the appearance on the October 07, 2019 cervical MRI.

## 2020-09-16 IMAGING — MG MAMMOGRAPHY SCREENING BILATERAL 3D TOMOSYNTHESIS WITH CAD
8 series · 8 of 24 positions shown · non-contrast
Comparison: No prior examinations were available for comparison.

MAMMOGRAPHY SCREENING BILATERAL 3D TOMOSYNTHESIS WITH CAD, 09/16/2020 [DATE]: 
CLINICAL INDICATION: Screening exam.
TECHNIQUE: Digital bilateral mammograms and 3-D Tomosynthesis were obtained. 
These were interpreted both primarily and with the aid of computer-aided 
detection system. 
BREAST DENSITY: (Level B) There are scattered areas of fibroglandular density.

[L MLO]
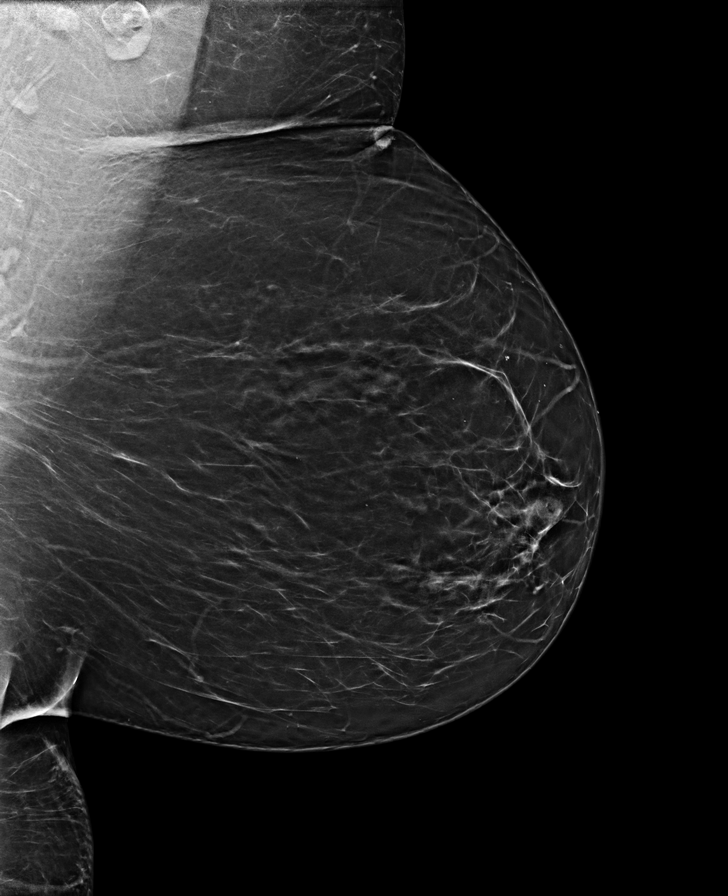

[L CC]
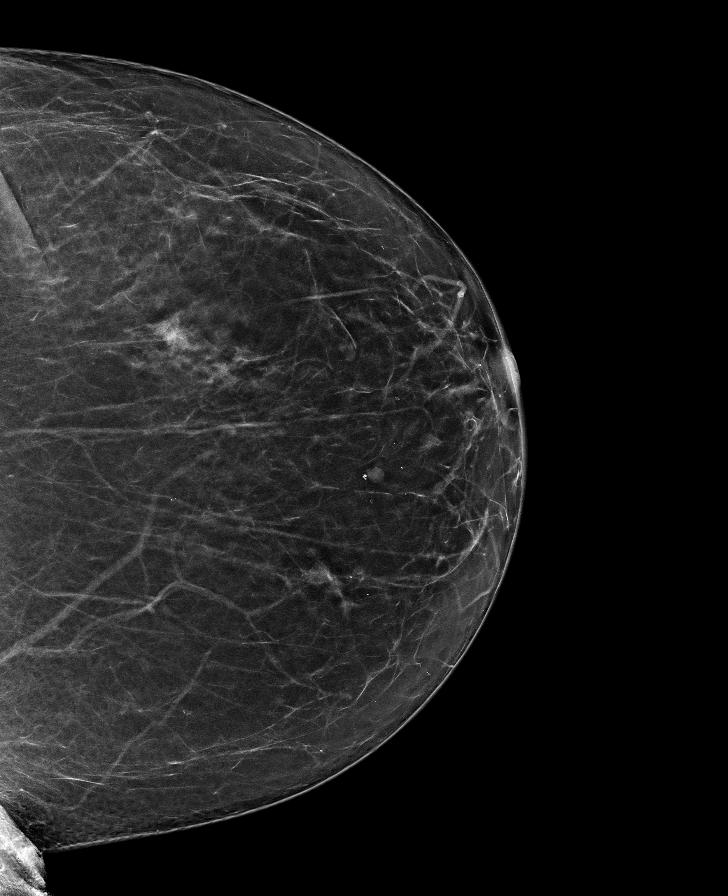

[R MLO]
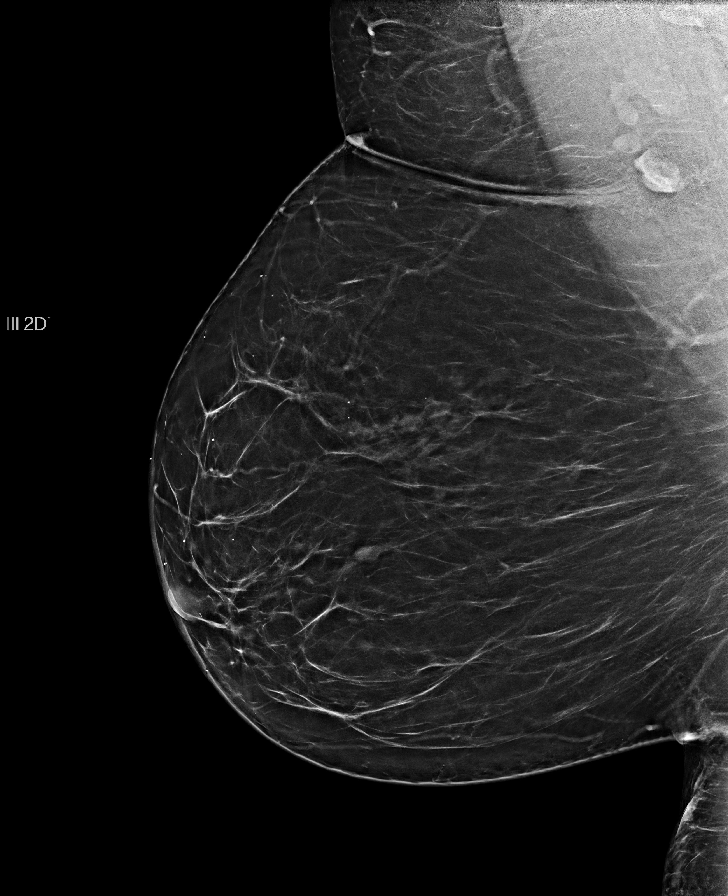

[R CC]
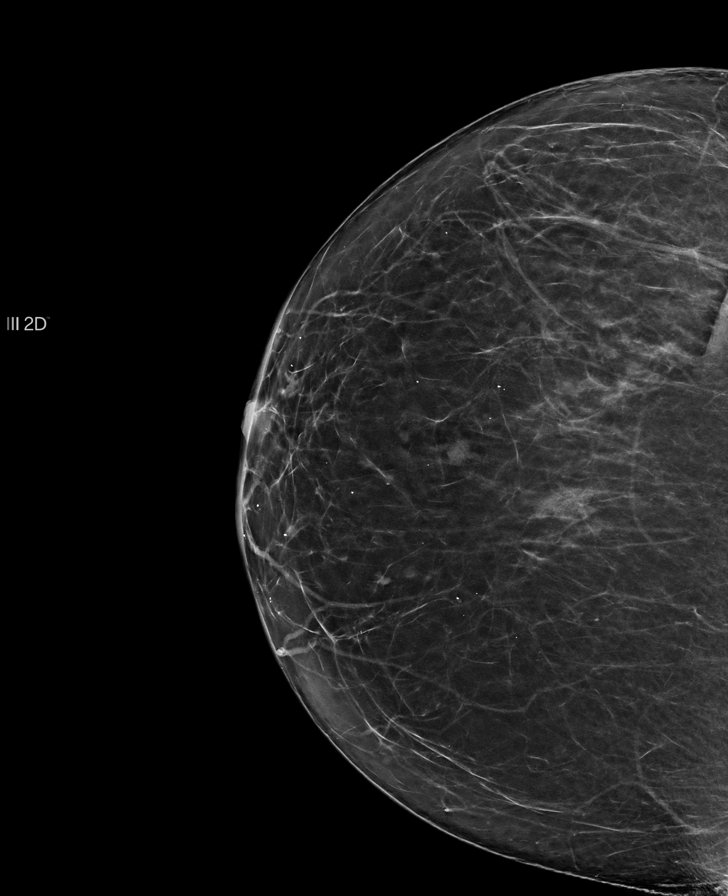

[R MLO tomo · tomo slice 45/90.0]
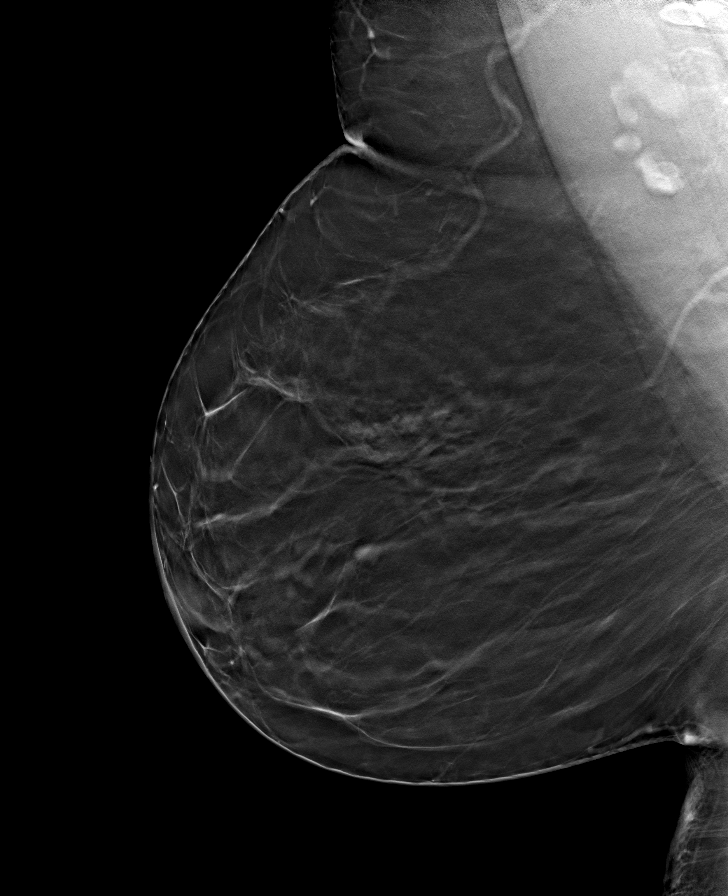

[L MLO tomo · tomo slice 46/91.0]
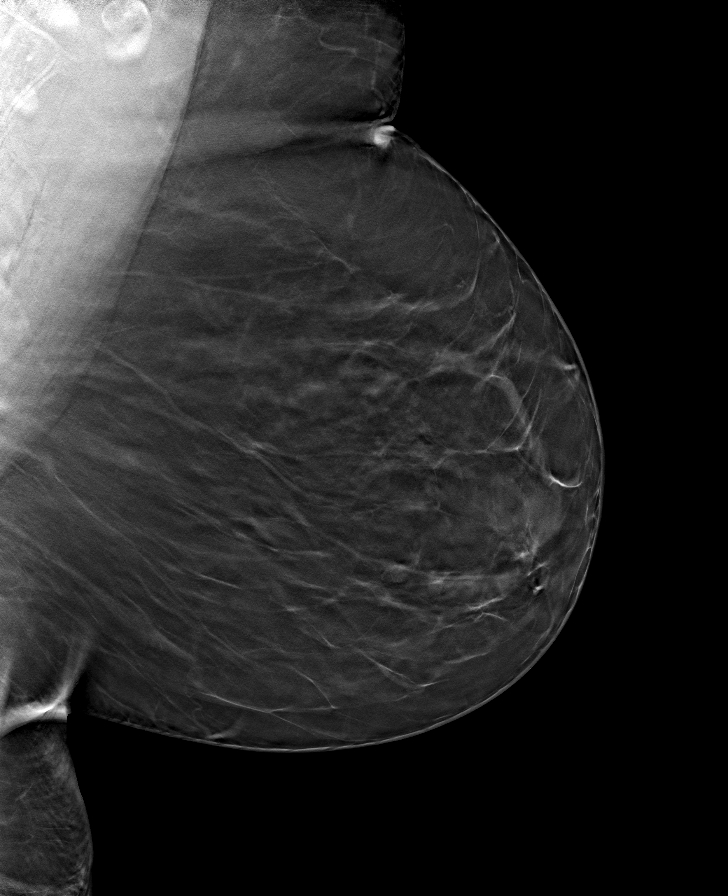

[L CC tomo · tomo slice 35/68.0]
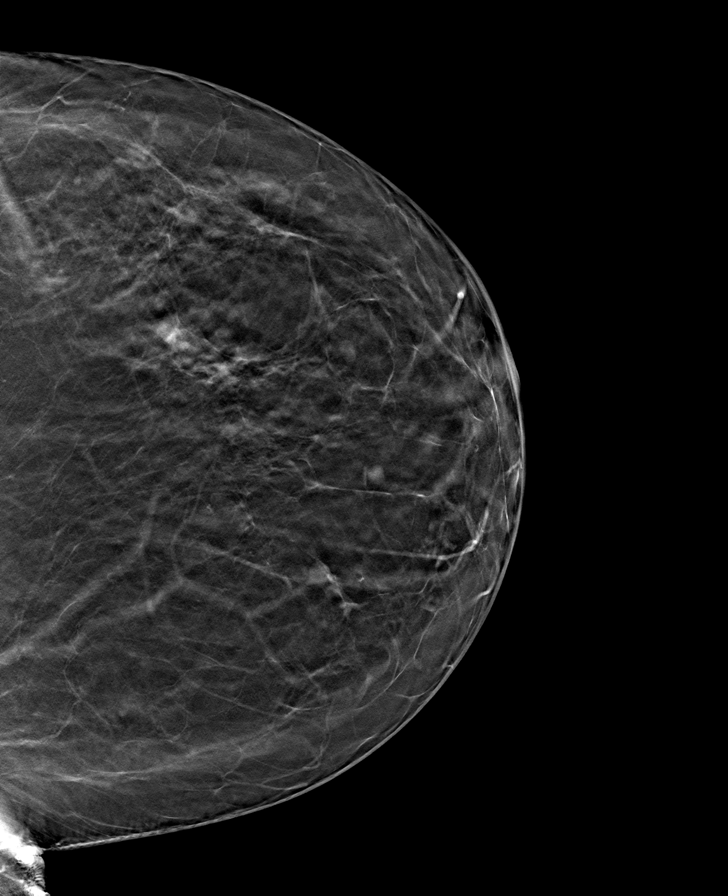

[R CC tomo · tomo slice 32/63.0]
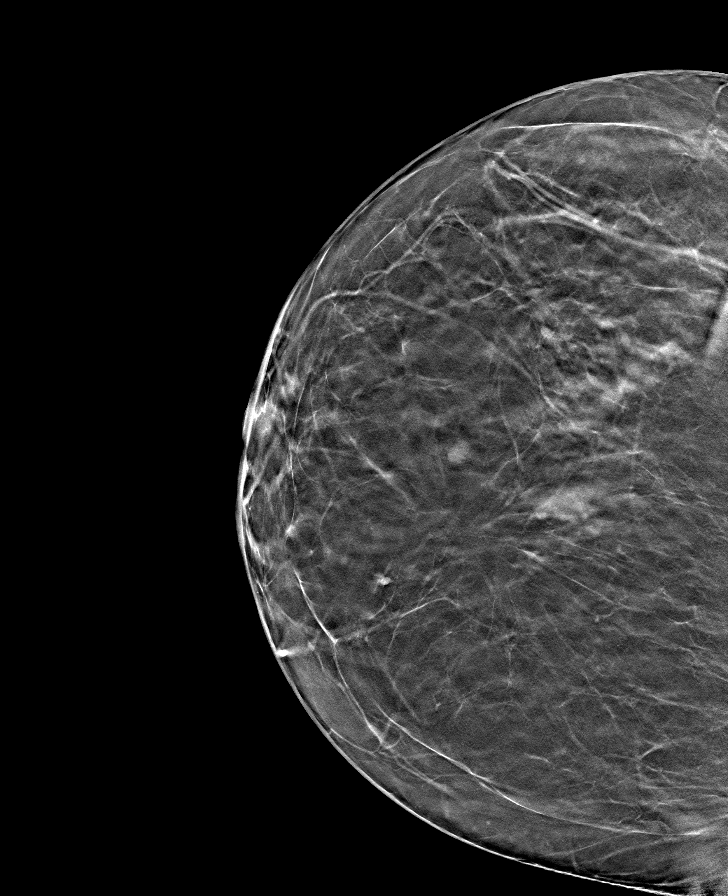

[8 of 24 positions shown; findings below may reference images not displayed]

Please note 
comparison with prior examinations increases the sensitivity for the detection 
of malignancy and decreases the false positive recall rate. If prior 
examinations are made available an addendum to this report will be generated.
FINDINGS: There are excisional biopsy changes involving the medial right breast. 
No suspicious mass, calcifications, or area of architectural distortion in 
either breast.
IMPRESSION: No mammographic evidence of malignancy in either breast. 
( BI-RADS 2) Benign findings. Routine mammographic follow-up is recommended.

## 2022-03-02 IMAGING — DX LUMBAR SPINE 3 VIEW
1 series · 3 of 3 positions shown · non-contrast
Comparison: MR lumbar 01/23/2020, CT lumbar 01/08/2020

________________________________________________________________________________________________ 
LUMBAR SPINE 3 VIEW, 03/02/2022 [DATE]: 
CLINICAL INDICATION: Spondylosis; DDD; Spinal stenosis; C4-C5 fusion; L4-L5 
fusion; RA, chronic low back pain

[Series 1: AP · U · 0.14mm/px · 3 of 3 slices shown]
[im 1/3]
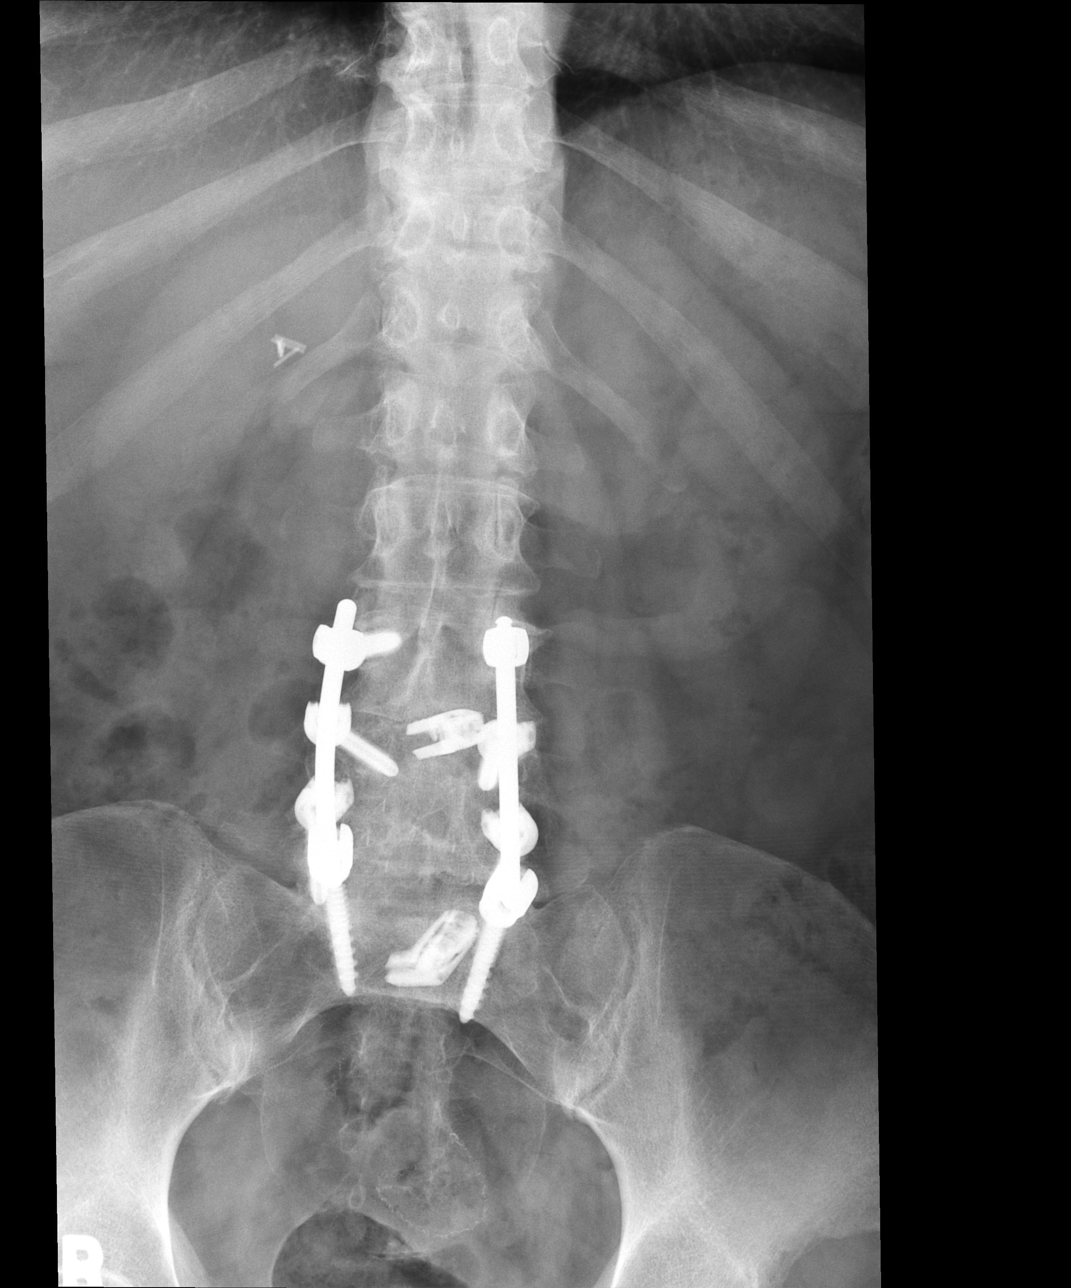
[im 2/3]
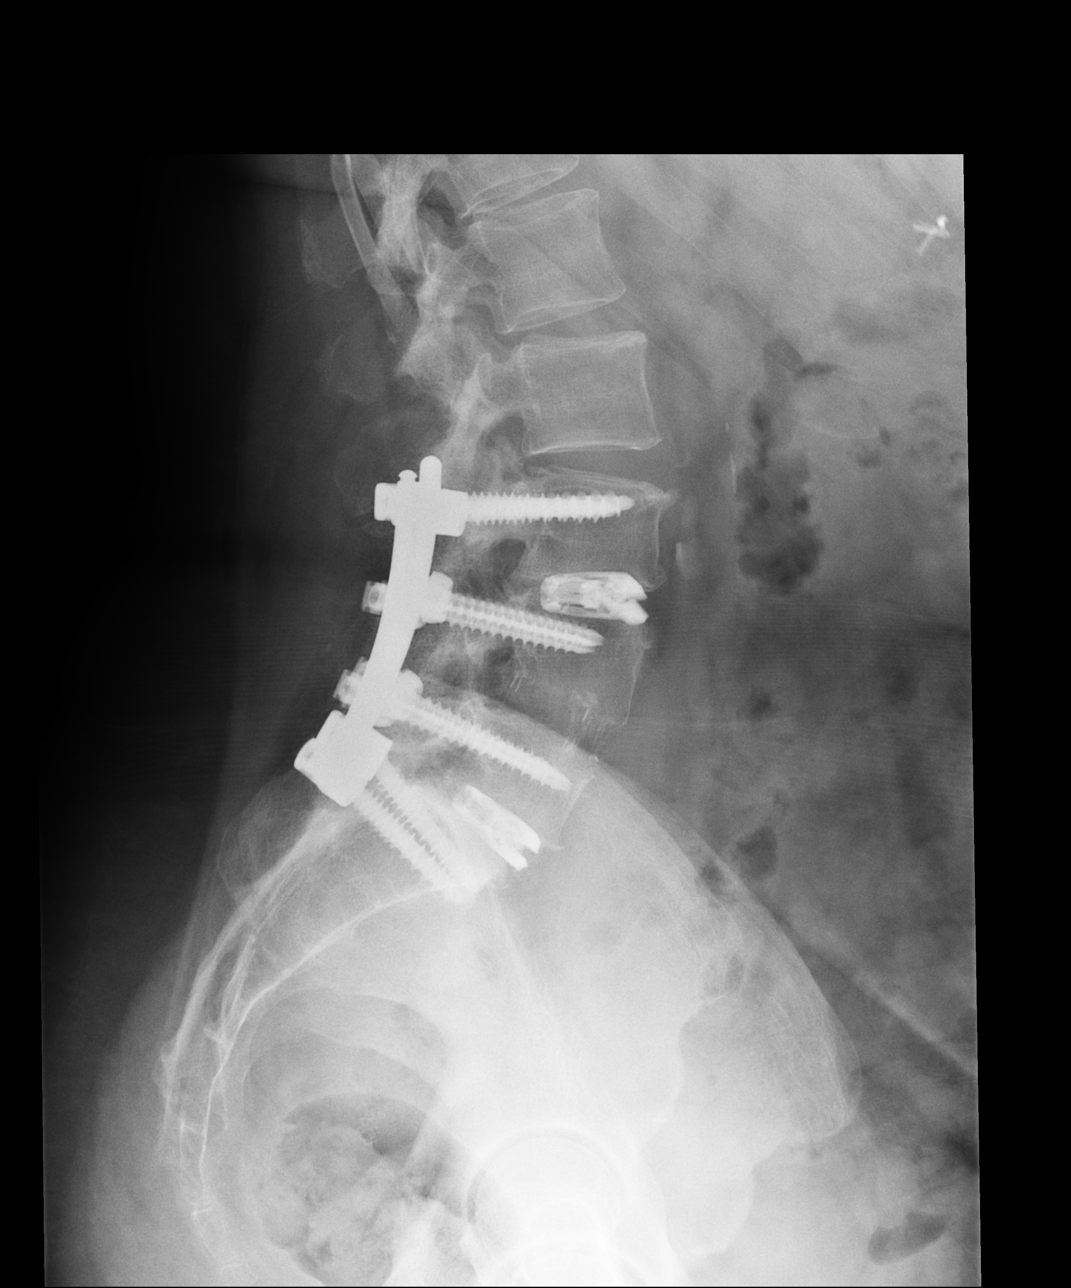
[im 3/3]
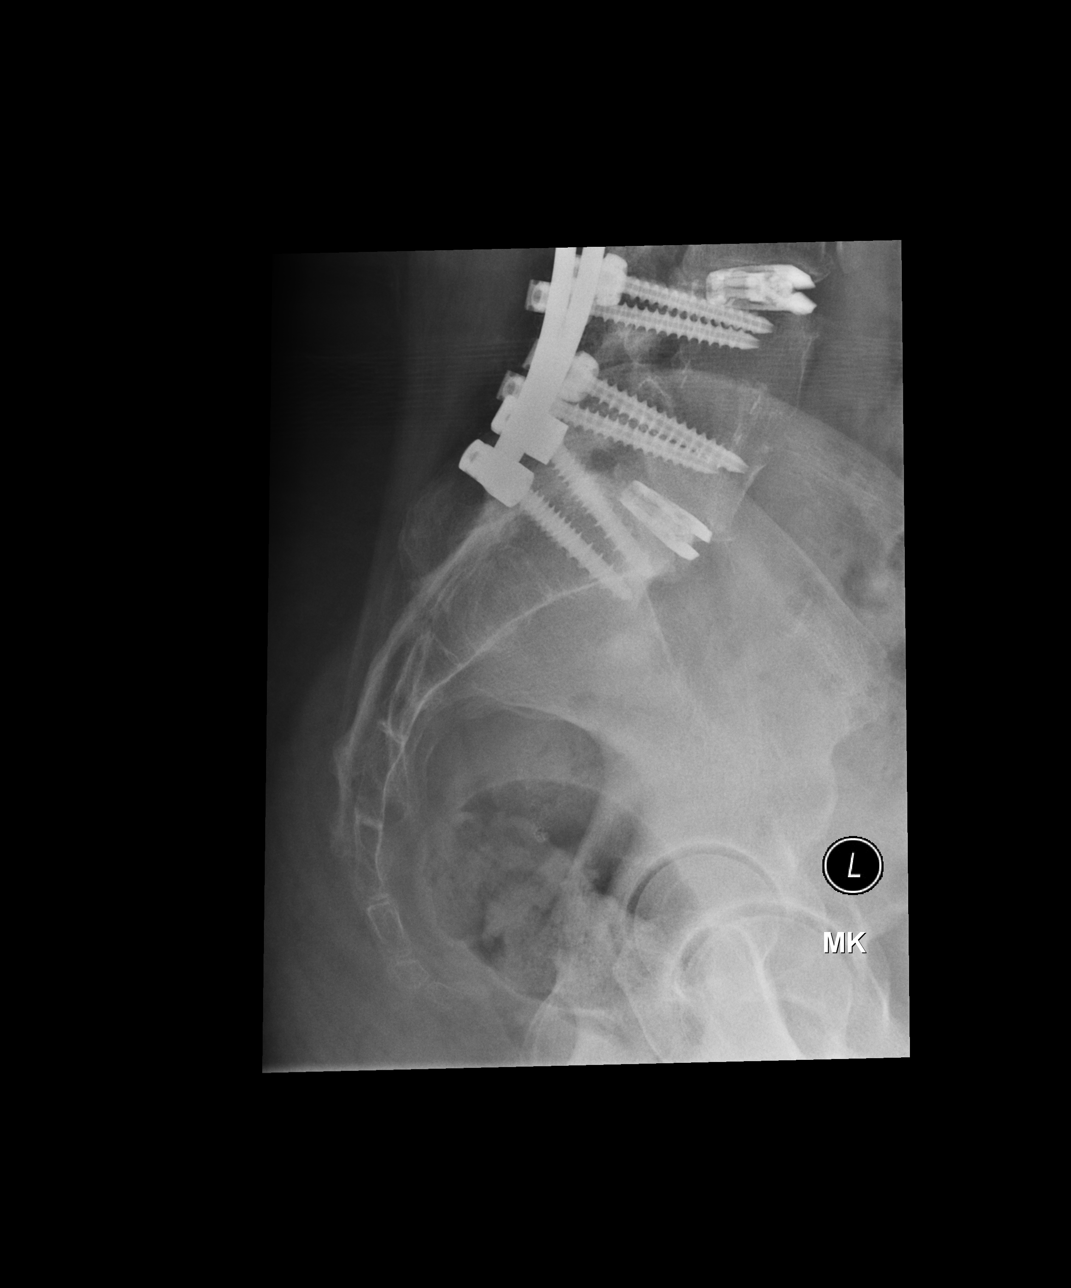

[3 of 3 positions shown; findings below may reference images not displayed]

FINDINGS: Previous transpedicular fusion with interconnecting rods, interval 
revision of hardware with addition of L3 and S1 transpedicular screws and 
interconnecting rods. Interval Placement L3/L4 and L5/S1 disc spacers since the 
4448 exam with restoration of disc space heights. L5/S1 alignment difficult to 
assess. Prior posterior decompression L3-L5. No complication identified. 
Hardware intact SI joints are open. Sacral arches intact. Right upper quadrant 
clips. Rectal suture margin.
IMPRESSION: Interval revision of the lumbar hardware, now L3-S1 transpedicular fusion, 
interconnecting rods with intervertebral disc spacers at all levels. No 
complication identified. L5/S1 alignment limited assessment.

## 2022-03-02 IMAGING — DX SHOULDER LEFT 2 VIEWS
1 series · 2 of 2 positions shown · non-contrast
Comparison: None.

________________________________________________________________________________________________ 
SHOULDER RIGHT 2 VIEWS, SHOULDER LEFT 2 VIEWS, 03/02/2022 [DATE]: 
CLINICAL INDICATION: Rheumatoid arthritis.

[Series 1: ap int rot · U · 0.14mm/px · 2 of 2 slices shown]
[im 1/2]
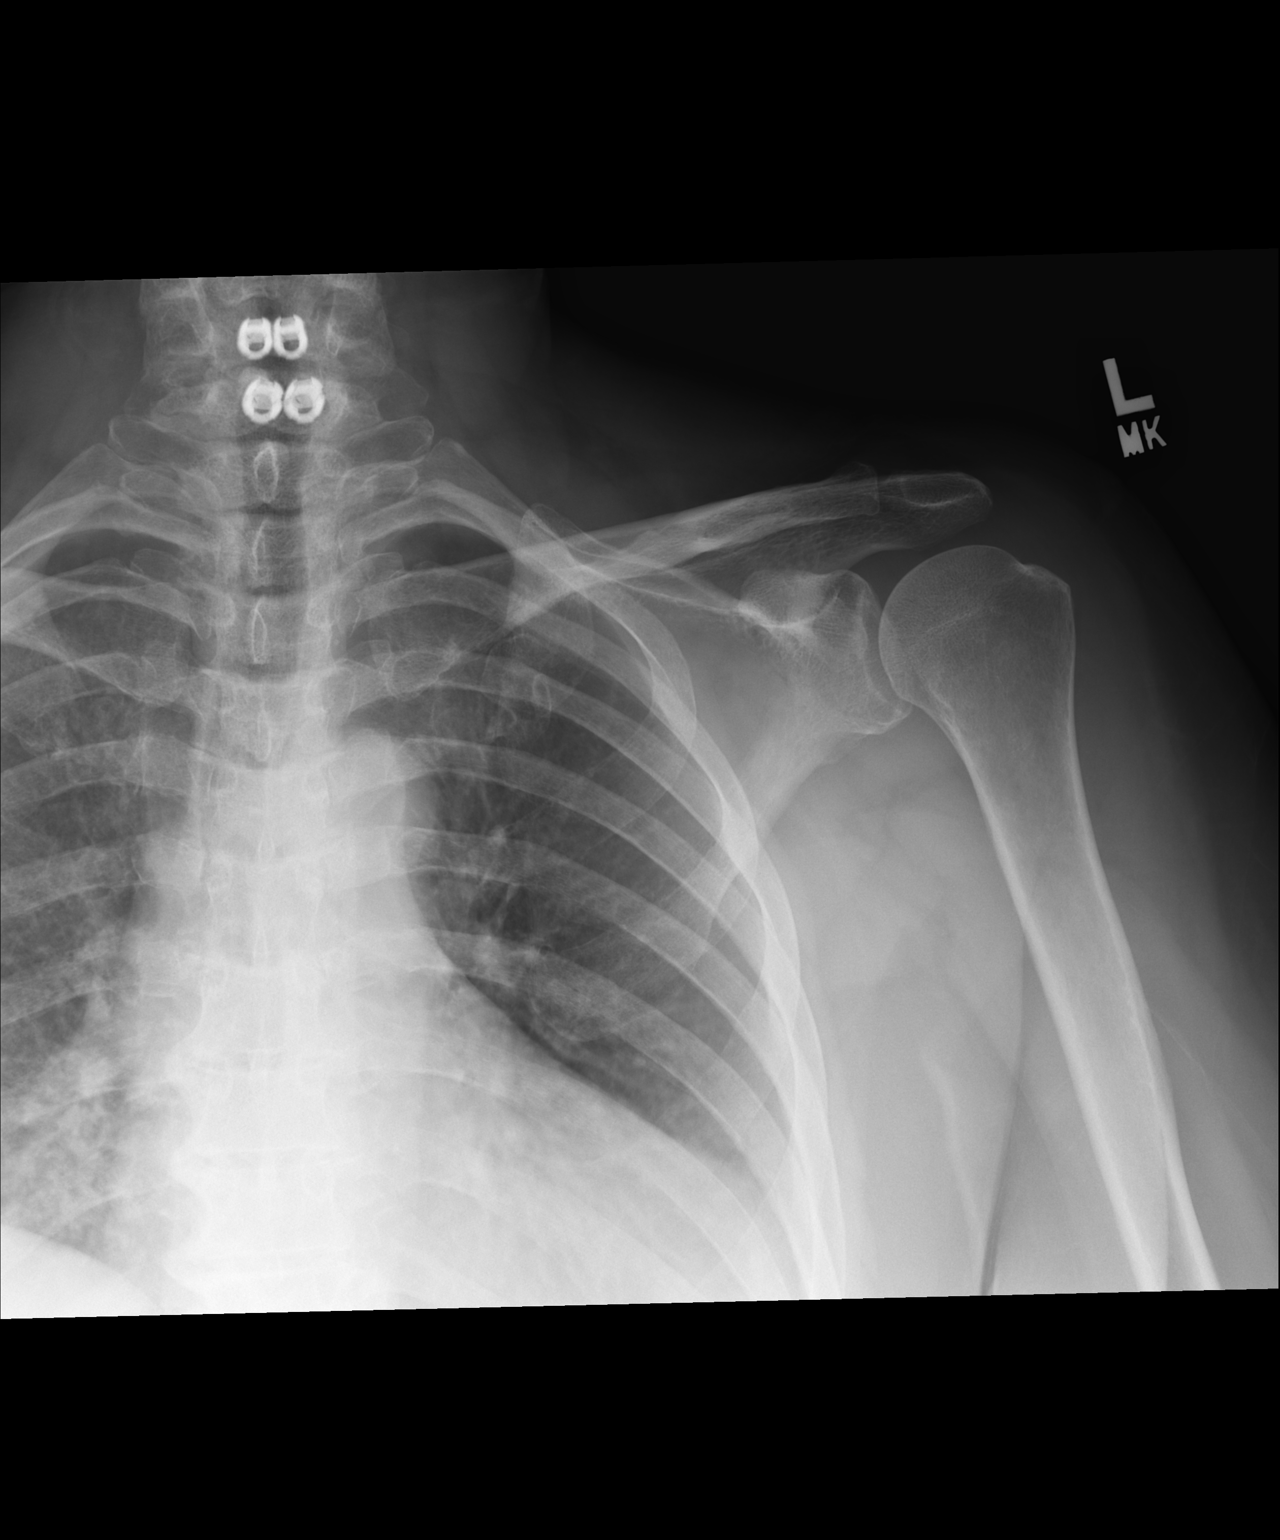
[im 2/2]
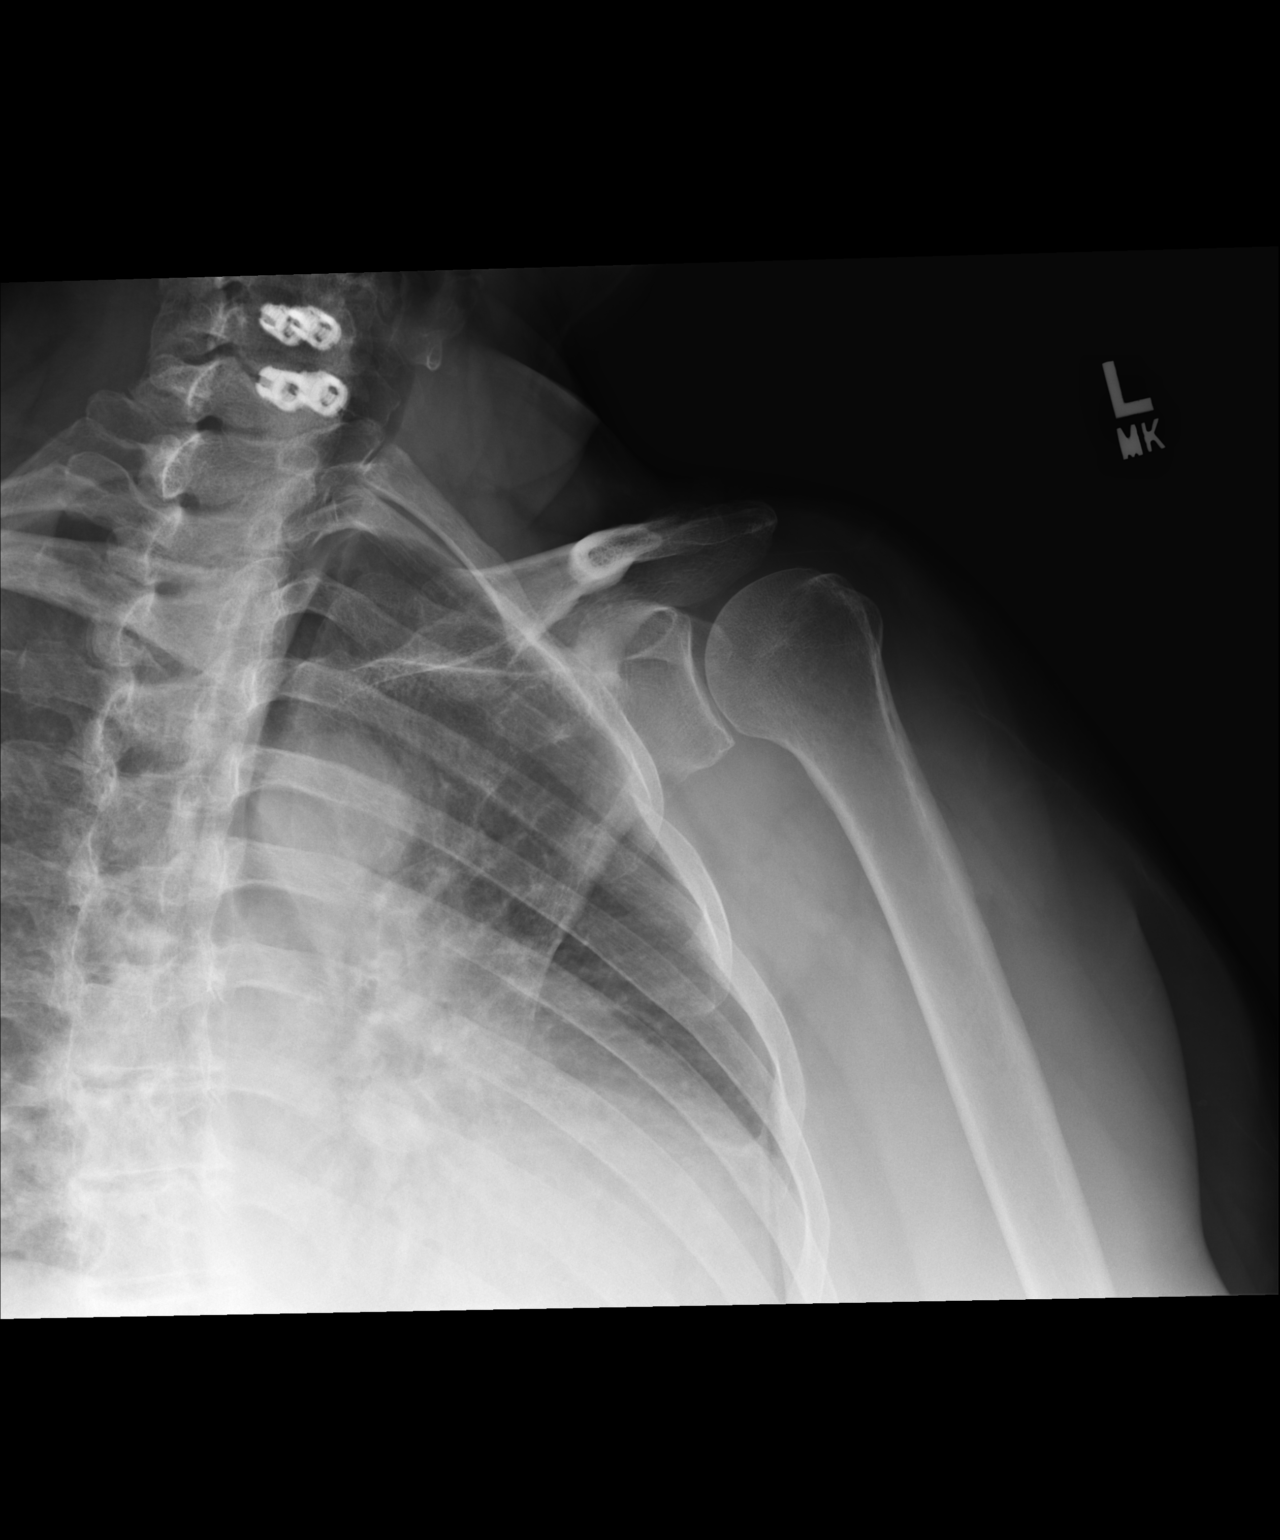

[2 of 2 positions shown; findings below may reference images not displayed]

FINDINGS: No erosions, fractures or dislocations. Joint spaces are preserved. 
Postsurgical change of the lower cervical spine. Normal bone density. No soft 
tissue swelling.
IMPRESSION: Normal shoulders.

## 2022-03-02 IMAGING — DX HIP BILATERAL 2 VIEWS
1 series · 4 of 4 positions shown · non-contrast
Comparison: None.

________________________________________________________________________________________________ 
HIP BILATERAL 2 VIEWS, 03/02/2022 [DATE]: 
CLINICAL INDICATION: RA.

[Series 1: AP · U · 0.14mm/px · 4 of 4 slices shown]
[im 1/4]
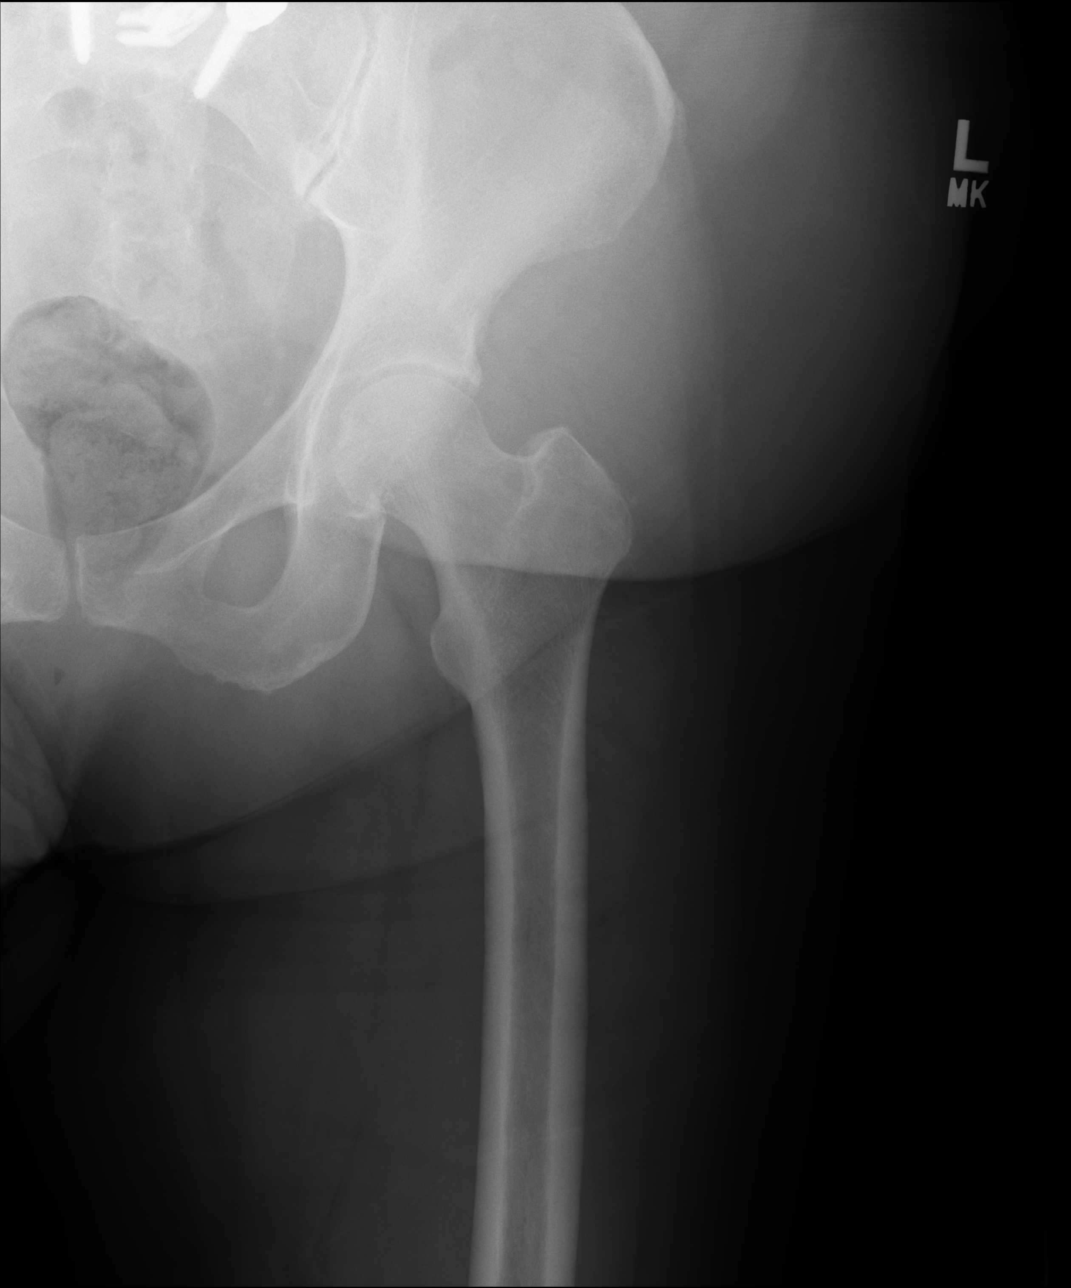
[im 2/4]
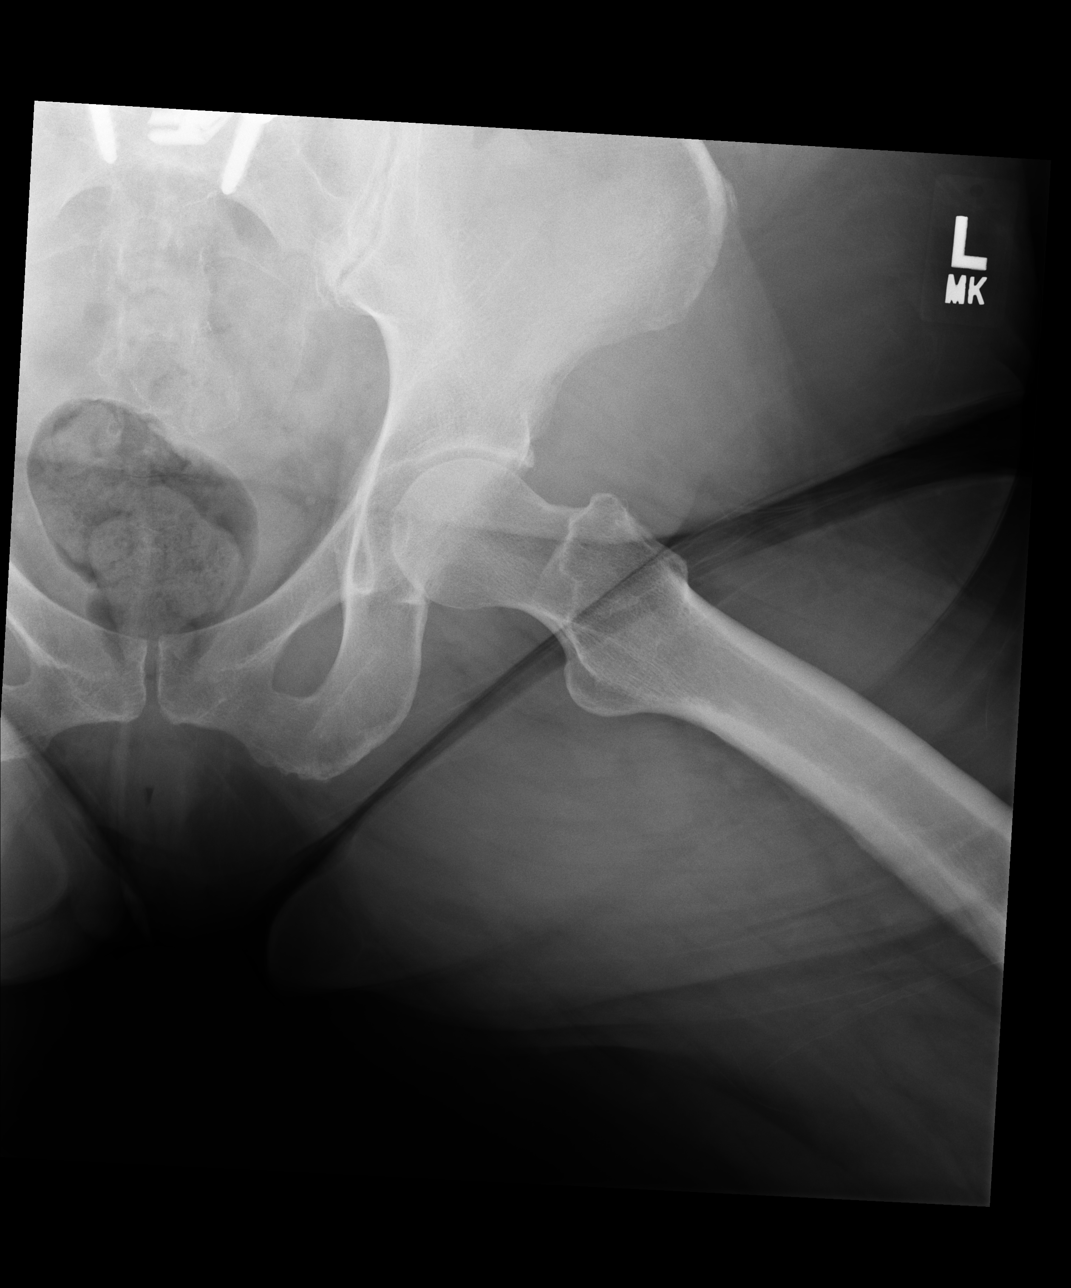
[im 3/4]
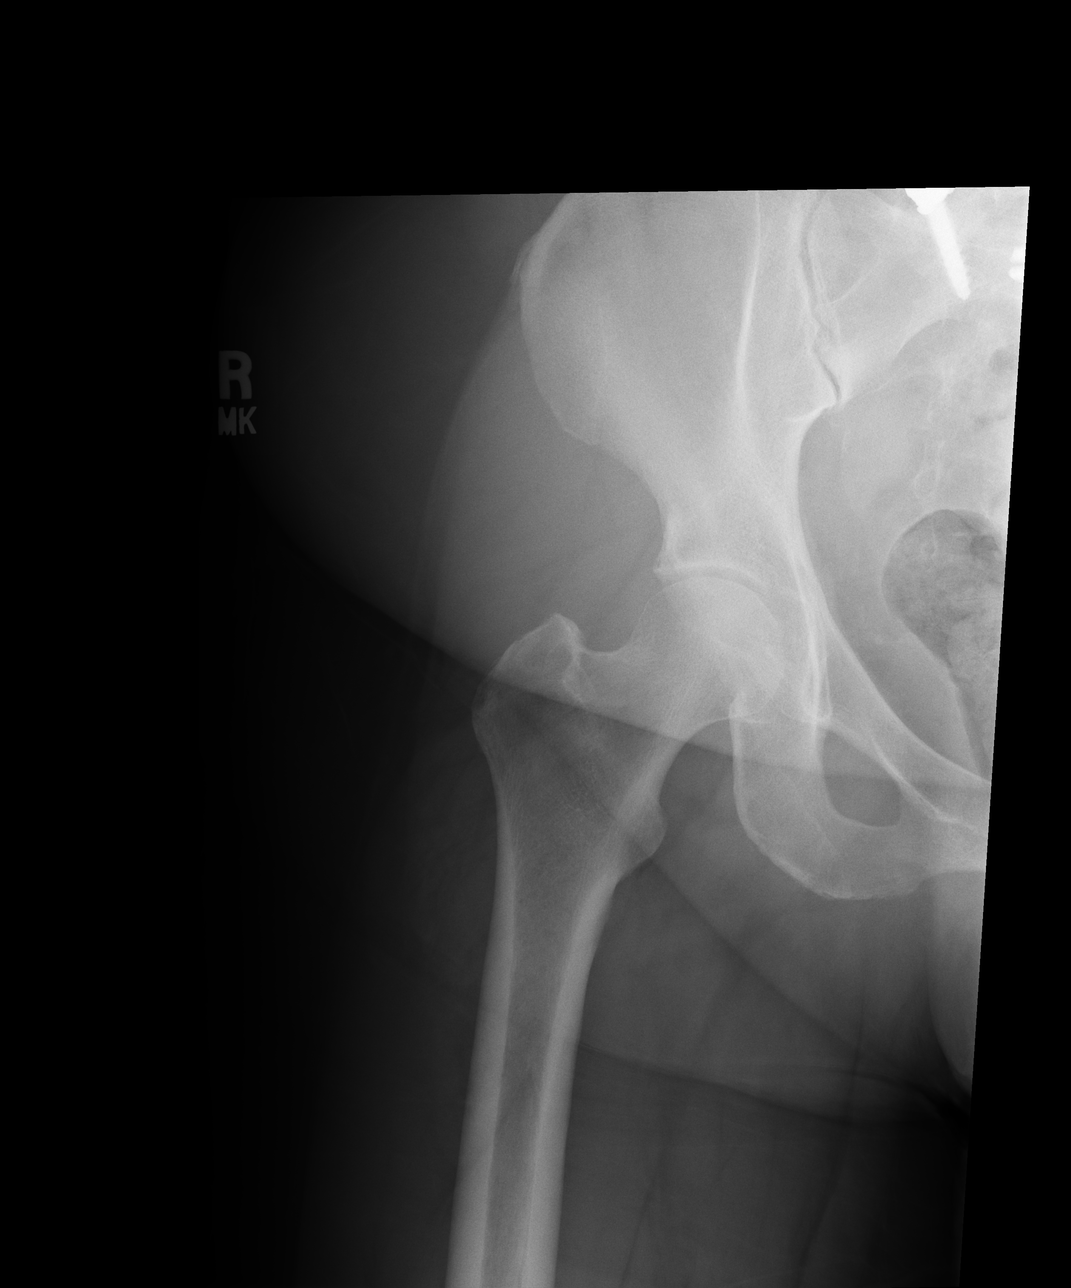
[im 4/4]
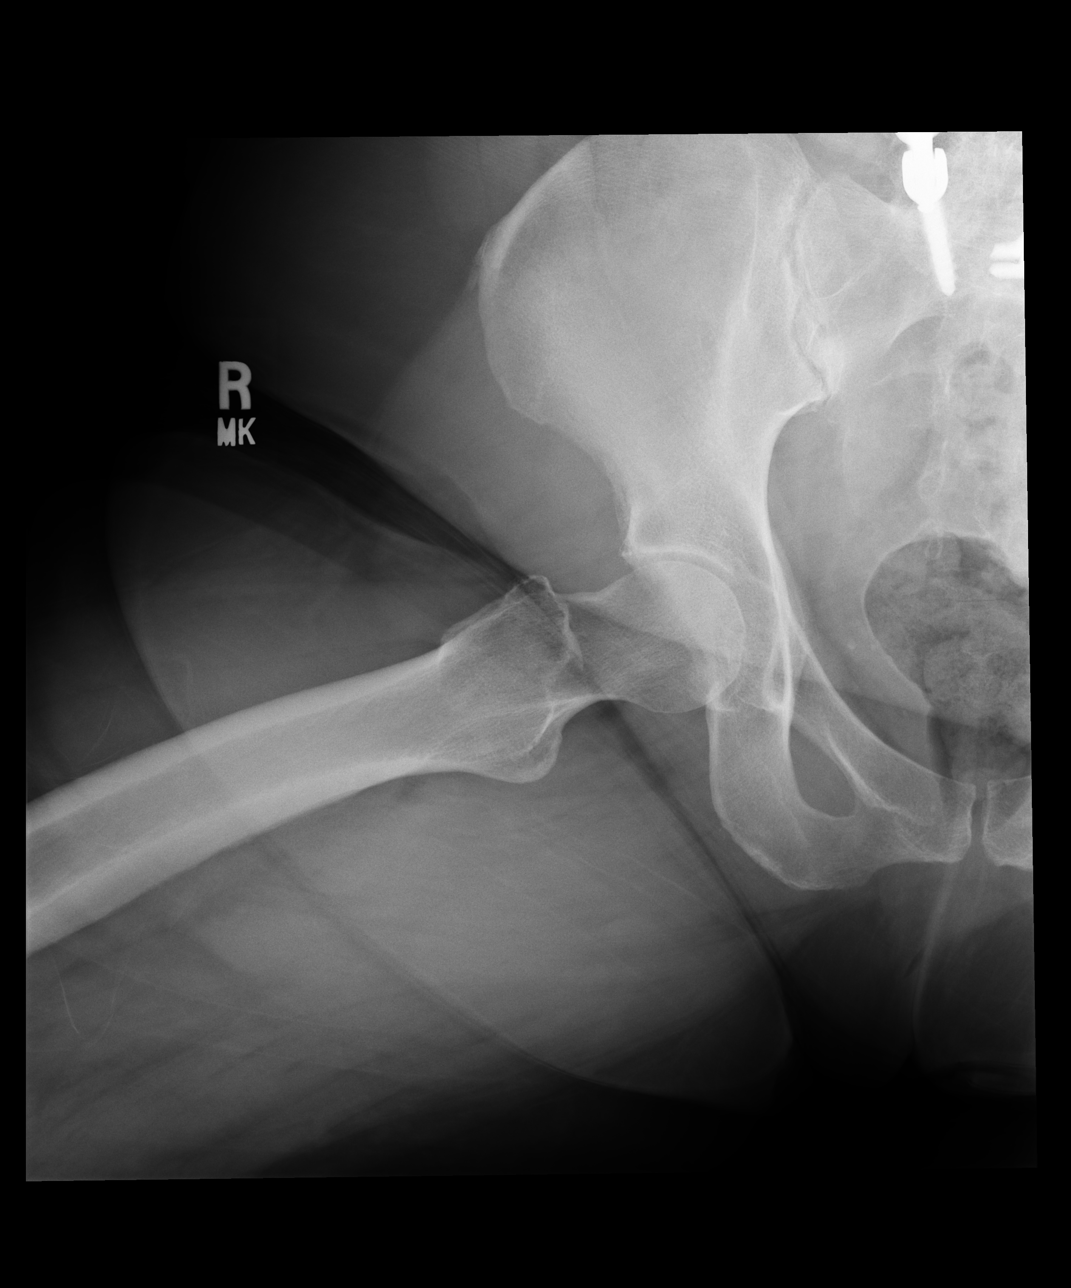

[4 of 4 positions shown; findings below may reference images not displayed]

FINDINGS: No erosions or fracture. Normal alignment. Hip joints are preserved. 
Degenerative change of the SI joints and lumbosacral postsurgical change. No 
focal soft tissue swelling.
IMPRESSION: 1.  Normal hips. 
2.  Degenerative change of the SI joints and lumbosacral postsurgical change.

## 2022-03-02 IMAGING — DX SHOULDER RIGHT 2 VIEWS
1 series · 2 of 2 positions shown · non-contrast
Comparison: None.

________________________________________________________________________________________________ 
SHOULDER RIGHT 2 VIEWS, SHOULDER LEFT 2 VIEWS, 03/02/2022 [DATE]: 
CLINICAL INDICATION: Rheumatoid arthritis.

[Series 1: ap int rot · U · 0.14mm/px · 2 of 2 slices shown]
[im 1/2]
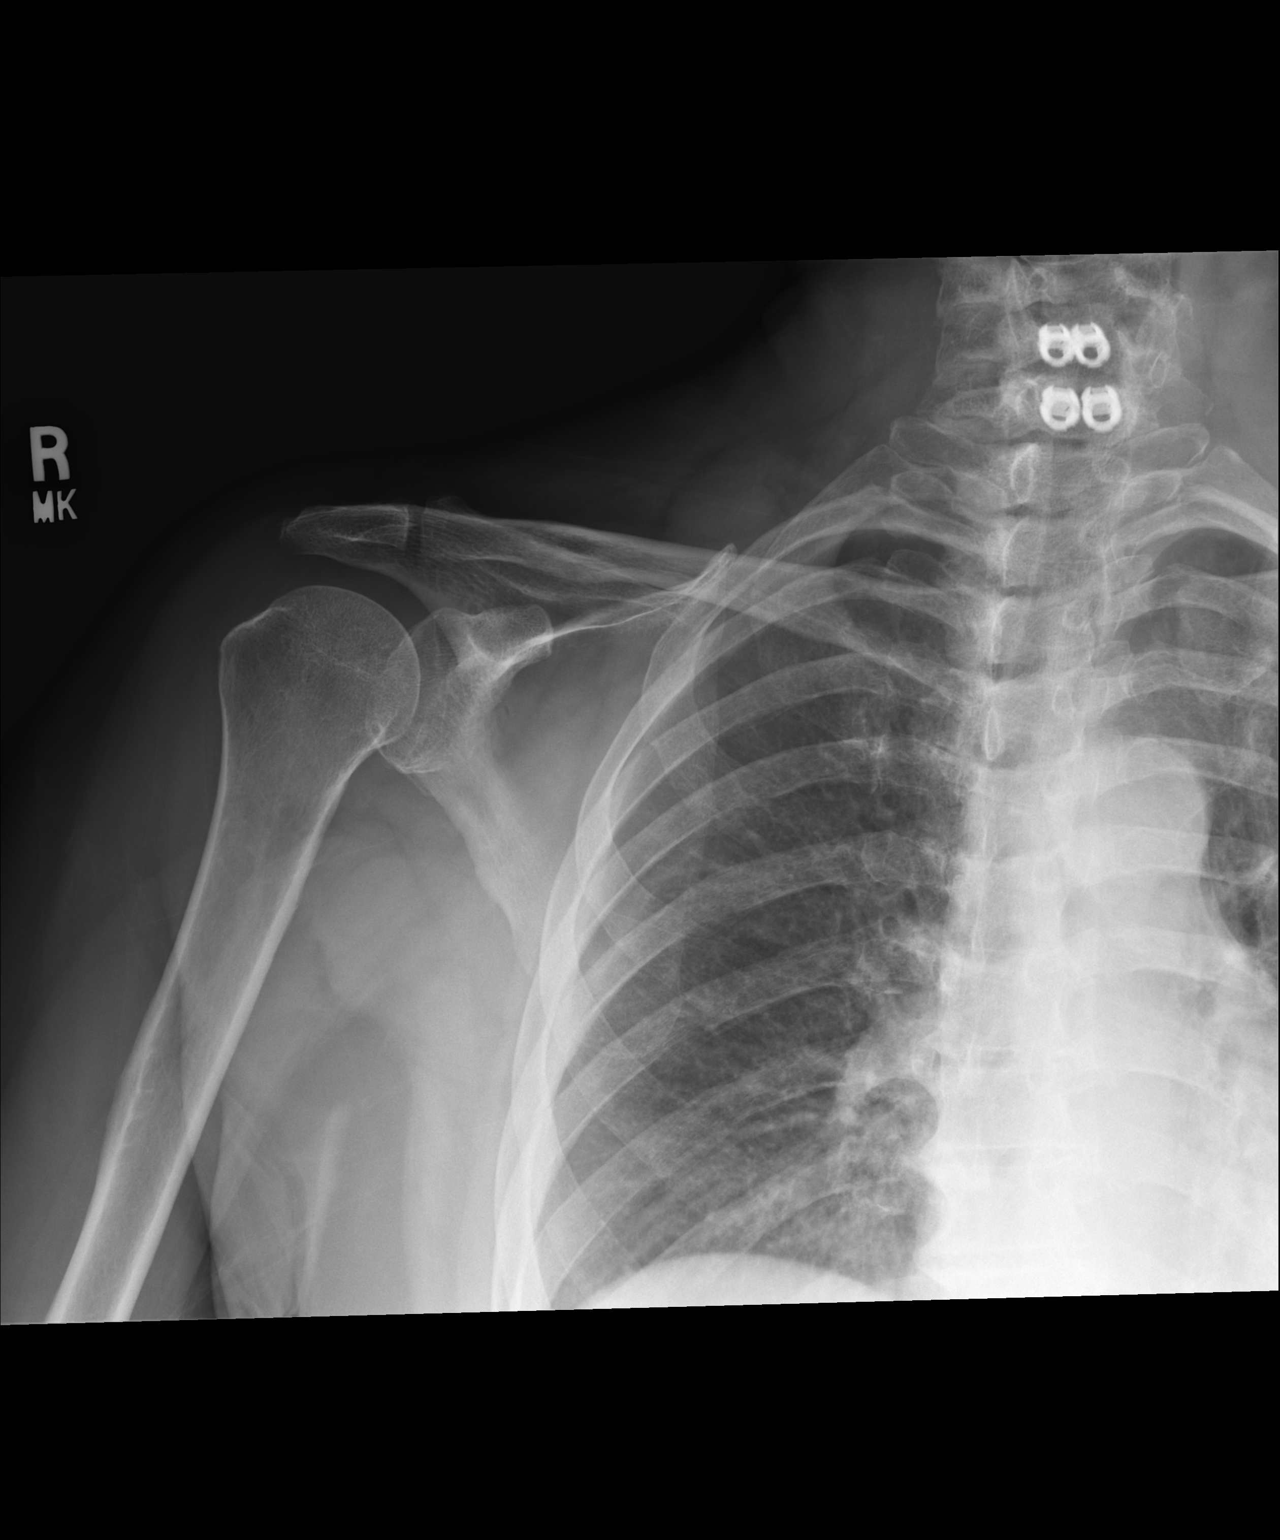
[im 2/2]
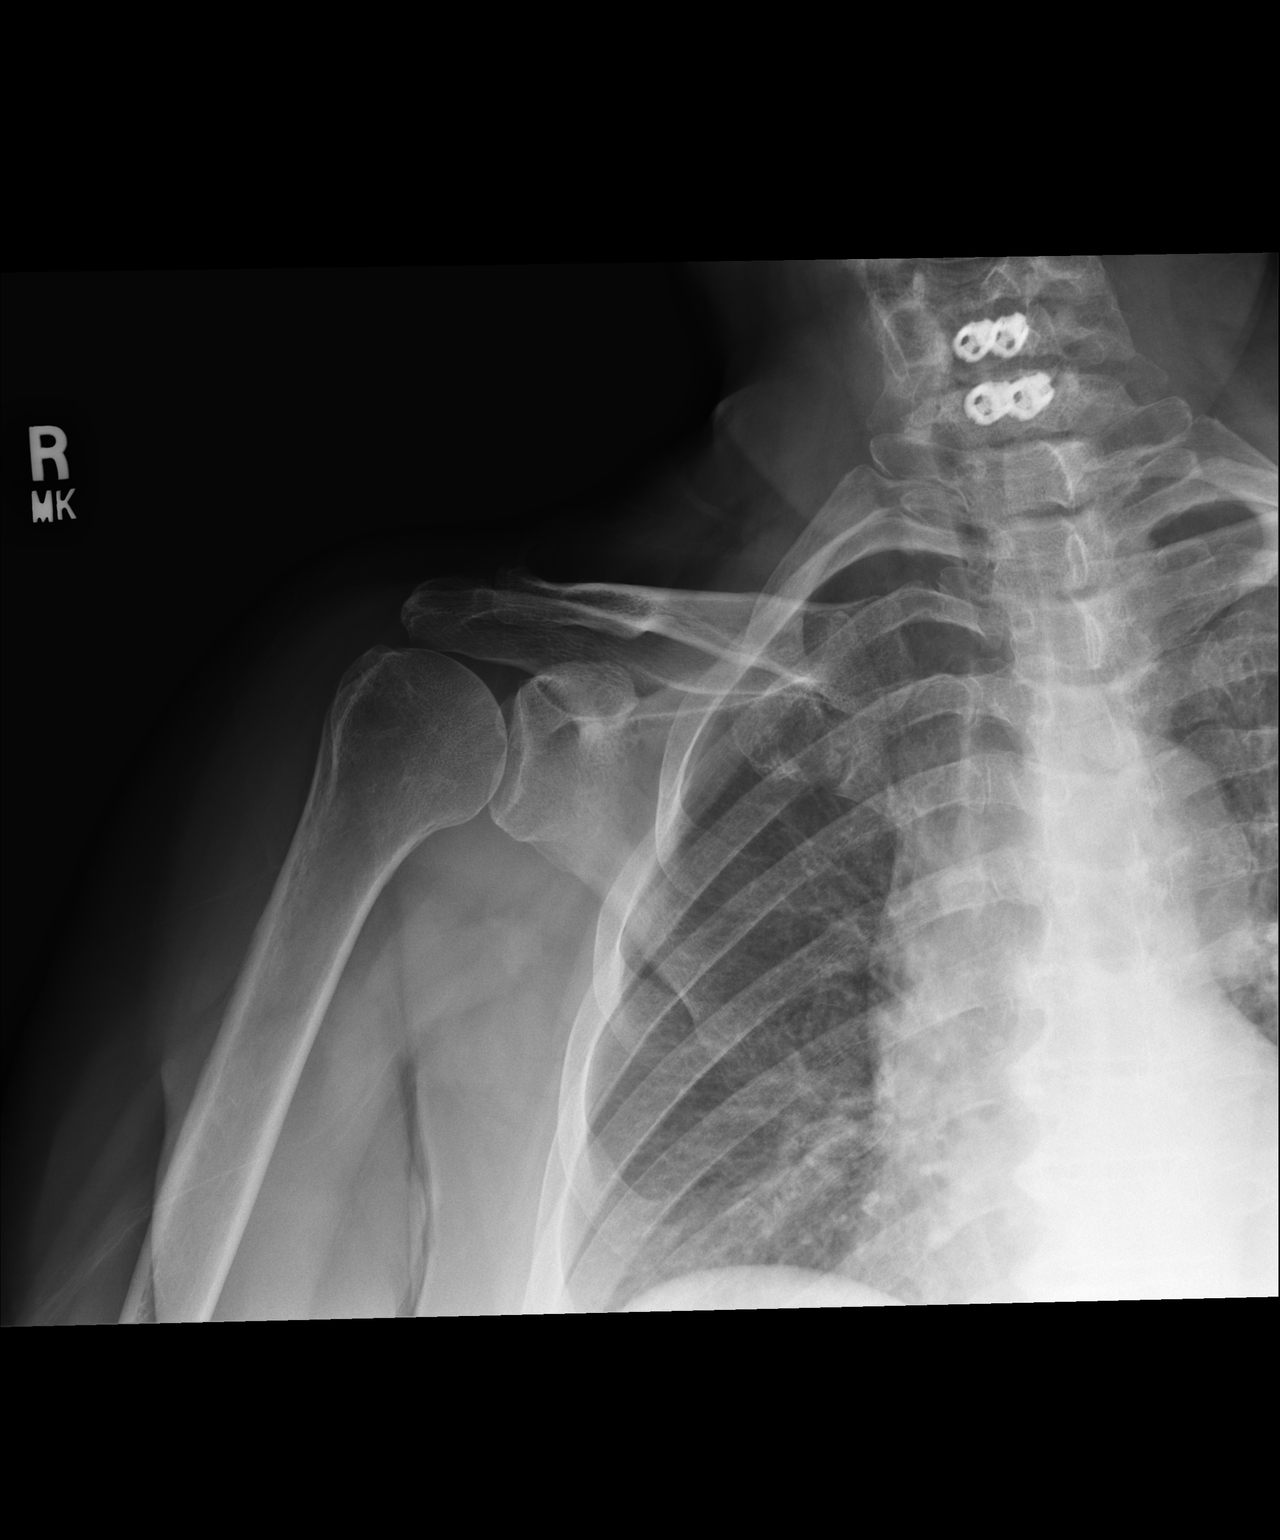

[2 of 2 positions shown; findings below may reference images not displayed]

FINDINGS: No erosions, fractures or dislocations. Joint spaces are preserved. 
Postsurgical change of the lower cervical spine. Normal bone density. No soft 
tissue swelling.
IMPRESSION: Normal shoulders.

## 2023-08-29 IMAGING — MR MRI LUMBAR SPINE WITHOUT CONTRAST
6 of 8 series · 12 of 48 positions shown · IV contrast (gadolinium)
Comparison: MRI lumbar spine from January 23, 2020.

________________________________________________________________________________________________ 
MRI LUMBAR SPINE WITHOUT CONTRAST, 08/29/2023 [DATE]: 
CLINICAL INDICATION: Left-sided radiculopathy. Left leg is weak.
TECHNIQUE: Multiplanar, multiecho position MR images of the lumbar spine were 
performed without intravenous gadolinium enhancement. Patient was scanned on a 
1.5T magnet

[Series 101: survey · axial · 10.0mm · 1.25mm/px · 1 of 10 slices shown]
[im 1/10]
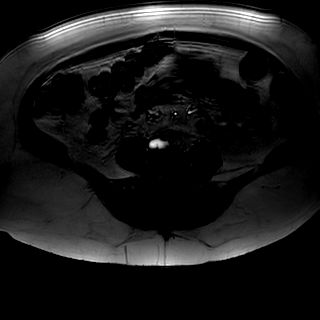

[Series 201: t2w_cor-surv · coronal · 6.0mm · 0.62mm/px · 1 of 10 slices shown]
[im 1/10]
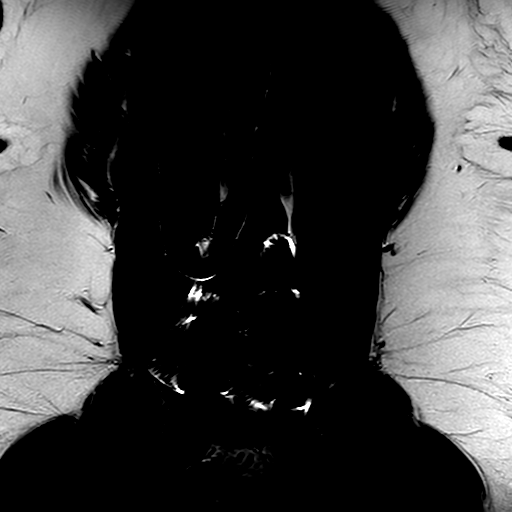

[Series 301: t1_tse_sag · sagittal · 4.0mm · 0.44mm/px · 2 of 19 slices shown]
[im 1/19]
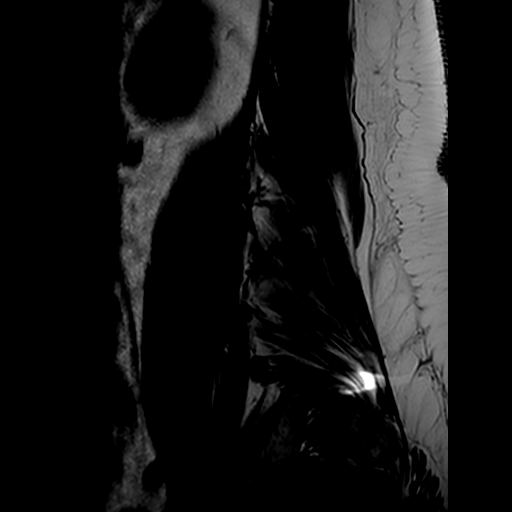
[im 19/19]
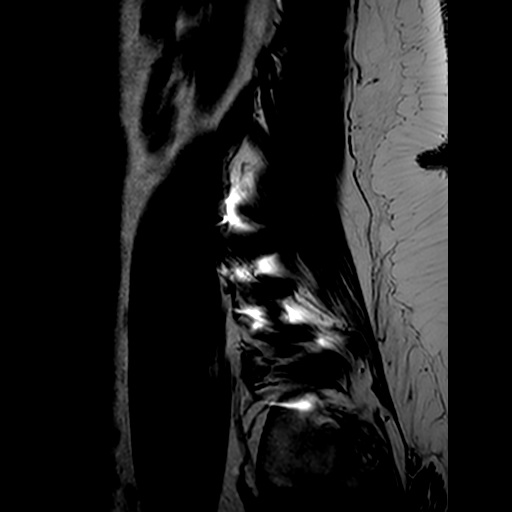

[Series 402: (id)_mdixon_tse · sagittal · 4.0mm · 0.53mm/px · 3 of 19 slices shown]
[im 1/19]
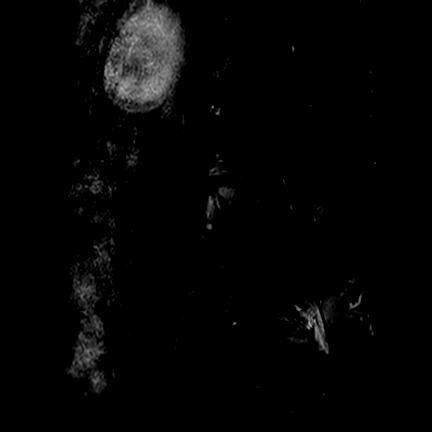
[im 10/19]
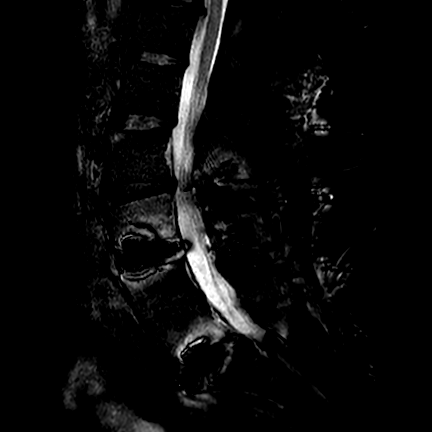
[im 19/19]
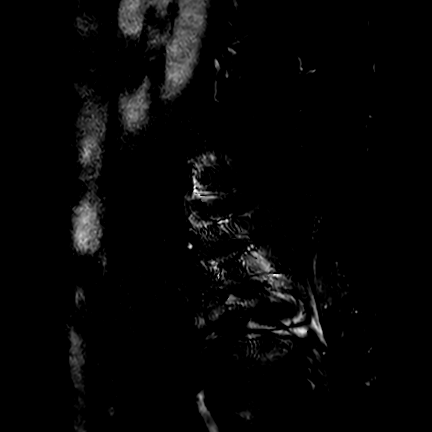

[Series 403: st2w_mdixon_tse · sagittal · 4.0mm · 0.53mm/px · 3 of 19 slices shown]
[im 1/19]
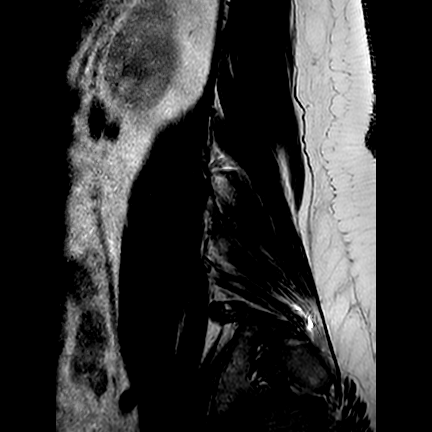
[im 10/19]
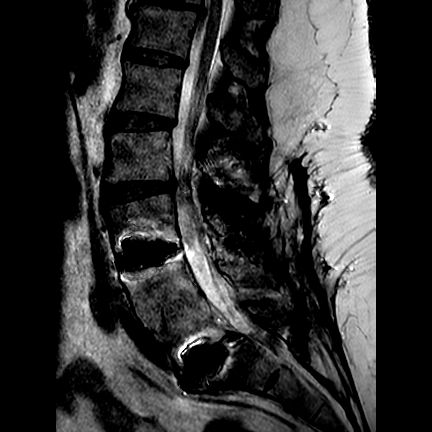
[im 19/19]
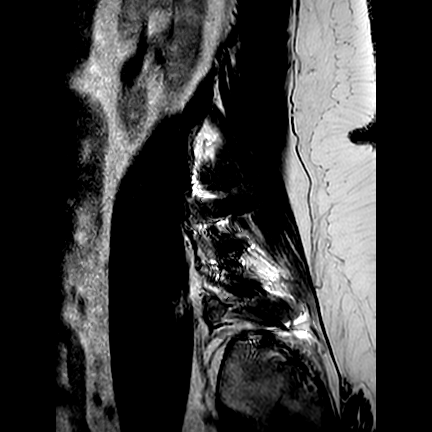

[Series 502: (id) view_ax mpr · axial · 1.0mm · 0.25mm/px · z∈[-70,-43]mm · 2 of 128 slices shown]
[im 8/128]
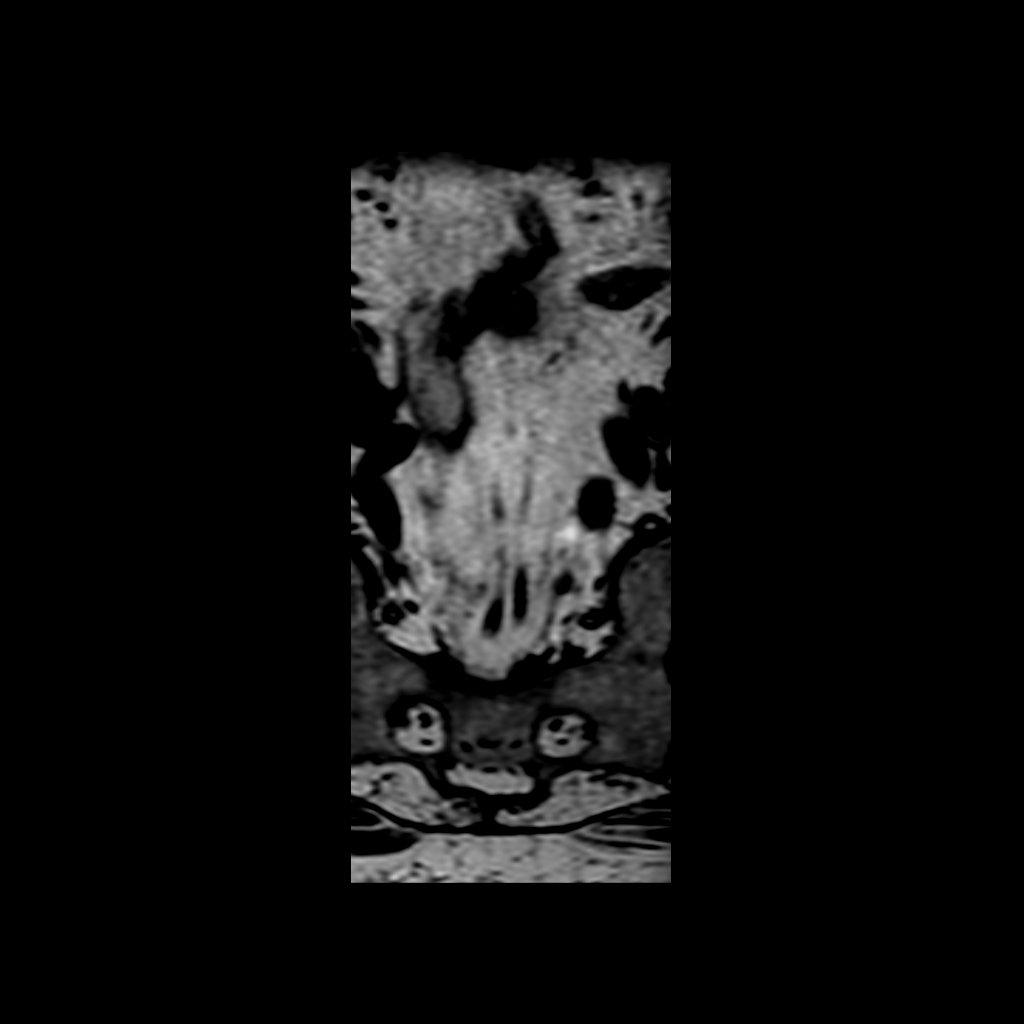
[im 23/128]
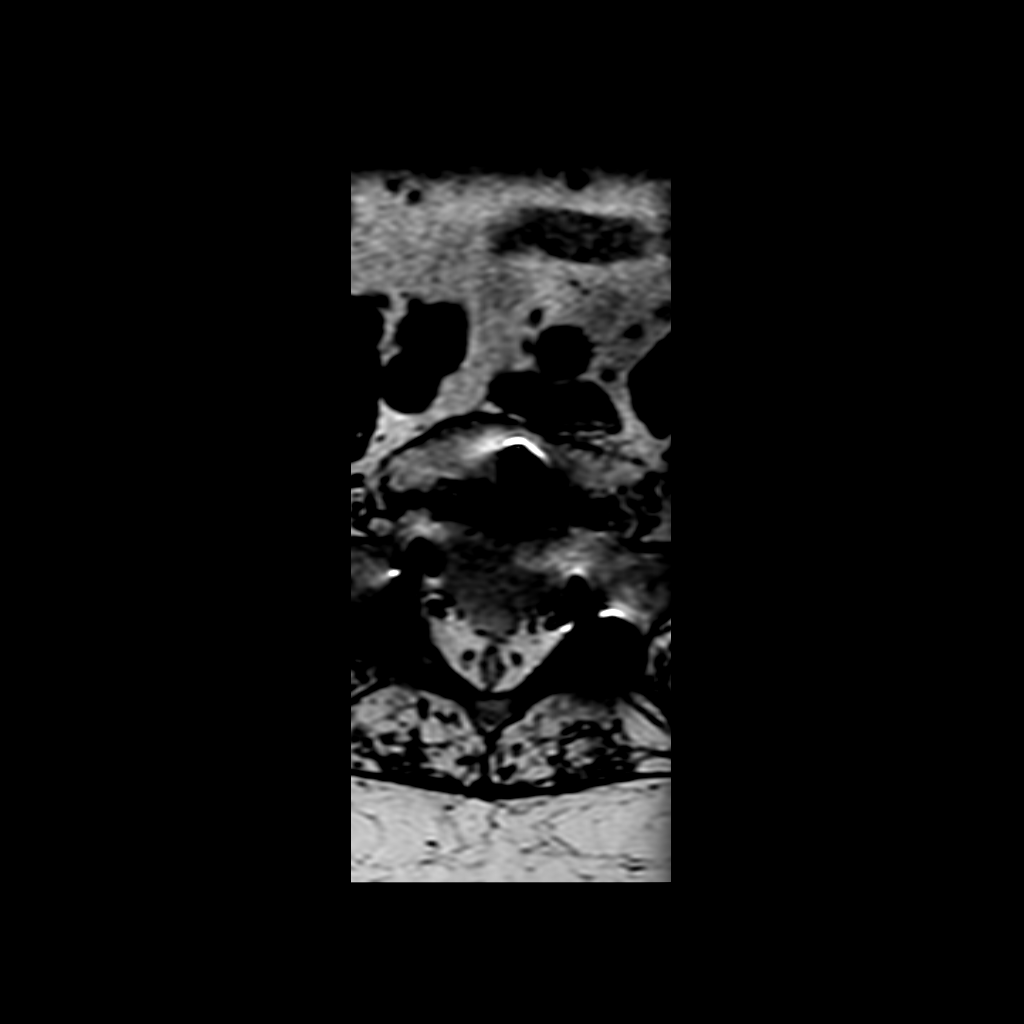

[12 of 48 positions shown; findings below may reference images not displayed]

FINDINGS: -------------------------------------------------------------------------------- 
------ 
GENERAL: 
POSTSURGICAL: Status post lumbar hardware fusion from L3 through S1 with pedicle 
screws and posterior fusion rods. Discectomy with intervertebral spacer 
placement at the L3-L4, L4-L5, L5-S1 levels. Osseous fusion across the disc 
space at L4-L5.     
ALIGNMENT: Mild retrolisthesis of L1 on L2. Mild anterolisthesis of L5 on S1. 
VERTEBRAL BODY HEIGHT: Normal.  
MARROW SIGNAL: No focal suspect signal abnormality. 
CORD SIGNAL: Normal distal spinal cord and cauda equina.  Conus terminates at 
L1-L2. 
ADDITIONAL FINDINGS: None. 
Modic I-II: None. 
Ligamentum Flavum > 2.5 mm: All nonsurgical levels. 
-------------------------------------------------------------------------------- 
------ 
SEGMENTAL: 
T11-T12: Loss of disc height with disc bulge, bilateral facet hypertrophy; mild 
central canal narrowing and no significant neural foraminal narrowing.  
T12-L1: Trace disc bulge eccentric to the left with bilateral facet hypertrophy; 
very mild left-sided central canal narrowing.  No significant right neural 
foraminal narrowing. No significant left neural foraminal narrowing.  
L1-L2: Uncovering of the disc space with bilateral facet hypertrophy; no 
significant central canal narrowing.  Mild right neural foraminal narrowing. 
Mild left neural foraminal narrowing.  
L2-L3: Disc bulge with bilateral facet/ligamentum flavum hypertrophy; mild 
central canal narrowing with associated bilateral subarticular recess narrowing. 
 Mild right neural foraminal narrowing. Mild left neural foraminal narrowing.  
L3-L4: Fusion level with discectomy and intervertebral spacer placement, osseous 
fusion across posterior elements; there is susceptibility artifact along 
bilateral subarticular recesses likely from postsurgical material; no 
significant central canal narrowing.  No significant right neural foraminal 
narrowing. No significant left neural foraminal narrowing.  
L4-L5: Fusion level with osseous fusion across discectomy site as well as across 
posterior elements; no significant central canal narrowing.  No significant 
right neural foraminal narrowing. No significant left neural foraminal 
narrowing.  
L5-S1: Fusion level, discectomy with intervertebral spacer placement at L5-S1, 
left foraminal to far lateral endplate osteophyte production; no significant 
central canal narrowing.  No significant right neural foraminal narrowing. 
Moderate left neural foraminal narrowing.  
-------------------------------------------------------------------------------- 
------ 
CHANGES FROM PRIOR: 
Post surgical changes at L3-L4 are new compared to the prior MRI. Postsurgical 
changes at L5-S1 are new compared to prior MRI. 
Progression in bilateral neural foraminal narrowing at L1-L2. 
Progression in central canal, bilateral subarticular recess narrowing at L2-L3. 
Progression in bilateral neural foraminal narrowing at L2-L3. 
Improvement in central canal and bilateral neural foraminal narrowing narrowing 
at L3-L4. 
-------------------------------------------------------------------------------- 
------
IMPRESSION: 1.  Discogenic/degenerative and postsurgical changes as above. 
2.  Progression in central canal, bilateral subarticular recess, bilateral 
neural foraminal narrowing at L2-L3. 
3.  Progression in bilateral neural foraminal narrowing at L1-L2.
